# Patient Record
Sex: Female | Born: 1945
Health system: Southern US, Community
[De-identification: ages and names within clinical notes are randomized; demographics above are authoritative.]

## PROBLEM LIST (undated history)

## (undated) DIAGNOSIS — C50919 Malignant neoplasm of unspecified site of unspecified female breast: Secondary | ICD-10-CM

## (undated) DIAGNOSIS — I341 Nonrheumatic mitral (valve) prolapse: Secondary | ICD-10-CM

## (undated) DIAGNOSIS — I10 Essential (primary) hypertension: Secondary | ICD-10-CM

## (undated) HISTORY — DX: Nonrheumatic mitral (valve) prolapse: I34.1

## (undated) HISTORY — DX: Malignant neoplasm of unspecified site of unspecified female breast: C50.919

## (undated) HISTORY — PX: ABDOMINAL HYSTERECTOMY: SHX81

## (undated) HISTORY — PX: BREAST RECONSTRUCTION: SHX9

## (undated) HISTORY — DX: Essential (primary) hypertension: I10

---

## 1986-10-24 DIAGNOSIS — C50919 Malignant neoplasm of unspecified site of unspecified female breast: Secondary | ICD-10-CM

## 1986-10-24 HISTORY — DX: Malignant neoplasm of unspecified site of unspecified female breast: C50.919

## 1986-10-24 HISTORY — PX: MASTECTOMY: SHX3

## 1987-10-25 HISTORY — PX: MASTECTOMY SUBCUTANEOUS: SUR853

## 1987-10-25 HISTORY — PX: AUGMENTATION MAMMAPLASTY: SUR837

## 1987-10-25 HISTORY — PX: BREAST LUMPECTOMY: SHX2

## 2005-04-29 ENCOUNTER — Other Ambulatory Visit: Payer: Self-pay

## 2005-05-13 ENCOUNTER — Inpatient Hospital Stay: Payer: Self-pay | Admitting: Obstetrics and Gynecology

## 2006-02-07 ENCOUNTER — Ambulatory Visit: Payer: Self-pay | Admitting: Gastroenterology

## 2010-10-24 HISTORY — PX: BREAST LUMPECTOMY: SHX2

## 2011-04-13 ENCOUNTER — Ambulatory Visit: Payer: Self-pay | Admitting: Emergency Medicine

## 2011-04-15 ENCOUNTER — Ambulatory Visit: Payer: Self-pay | Admitting: Emergency Medicine

## 2011-04-29 ENCOUNTER — Ambulatory Visit: Payer: Self-pay | Admitting: Oncology

## 2011-05-02 ENCOUNTER — Ambulatory Visit: Payer: Self-pay | Admitting: Oncology

## 2011-05-06 ENCOUNTER — Ambulatory Visit: Payer: Self-pay | Admitting: Emergency Medicine

## 2011-05-10 LAB — PATHOLOGY REPORT

## 2011-05-25 ENCOUNTER — Ambulatory Visit: Payer: Self-pay | Admitting: Oncology

## 2011-06-25 ENCOUNTER — Ambulatory Visit: Payer: Self-pay | Admitting: Oncology

## 2011-07-25 ENCOUNTER — Ambulatory Visit: Payer: Self-pay | Admitting: Oncology

## 2011-08-25 ENCOUNTER — Ambulatory Visit: Payer: Self-pay | Admitting: Oncology

## 2011-09-24 ENCOUNTER — Ambulatory Visit: Payer: Self-pay | Admitting: Oncology

## 2011-12-15 ENCOUNTER — Ambulatory Visit: Payer: Self-pay | Admitting: Oncology

## 2011-12-15 DIAGNOSIS — C50919 Malignant neoplasm of unspecified site of unspecified female breast: Secondary | ICD-10-CM | POA: Diagnosis not present

## 2011-12-15 DIAGNOSIS — Z79899 Other long term (current) drug therapy: Secondary | ICD-10-CM | POA: Diagnosis not present

## 2011-12-15 DIAGNOSIS — Z853 Personal history of malignant neoplasm of breast: Secondary | ICD-10-CM | POA: Diagnosis not present

## 2011-12-15 DIAGNOSIS — Z170 Estrogen receptor positive status [ER+]: Secondary | ICD-10-CM | POA: Diagnosis not present

## 2011-12-15 DIAGNOSIS — Z9010 Acquired absence of unspecified breast and nipple: Secondary | ICD-10-CM | POA: Diagnosis not present

## 2011-12-15 DIAGNOSIS — Z923 Personal history of irradiation: Secondary | ICD-10-CM | POA: Diagnosis not present

## 2011-12-15 DIAGNOSIS — I10 Essential (primary) hypertension: Secondary | ICD-10-CM | POA: Diagnosis not present

## 2011-12-15 LAB — COMPREHENSIVE METABOLIC PANEL
Albumin: 3.9 g/dL (ref 3.4–5.0)
Alkaline Phosphatase: 104 U/L (ref 50–136)
BUN: 13 mg/dL (ref 7–18)
Bilirubin,Total: 0.3 mg/dL (ref 0.2–1.0)
Calcium, Total: 9.2 mg/dL (ref 8.5–10.1)
Creatinine: 0.77 mg/dL (ref 0.60–1.30)
Glucose: 97 mg/dL (ref 65–99)
Osmolality: 281 (ref 275–301)
Sodium: 141 mmol/L (ref 136–145)
Total Protein: 7.6 g/dL (ref 6.4–8.2)

## 2011-12-15 LAB — CBC CANCER CENTER
Basophil #: 0 x10 3/mm (ref 0.0–0.1)
Basophil %: 0.1 %
Eosinophil #: 0.4 x10 3/mm (ref 0.0–0.7)
HCT: 37.4 % (ref 35.0–47.0)
HGB: 12.7 g/dL (ref 12.0–16.0)
Lymphocyte %: 10.3 %
Monocyte #: 0.5 x10 3/mm (ref 0.0–0.7)
Monocyte %: 6.6 %
Neutrophil %: 77.1 %
Platelet: 198 x10 3/mm (ref 150–440)
RBC: 4.2 10*6/uL (ref 3.80–5.20)
RDW: 13.4 % (ref 11.5–14.5)
WBC: 7.3 x10 3/mm (ref 3.6–11.0)

## 2011-12-23 ENCOUNTER — Ambulatory Visit: Payer: Self-pay | Admitting: Oncology

## 2012-02-03 ENCOUNTER — Ambulatory Visit: Payer: Self-pay | Admitting: Oncology

## 2012-02-03 DIAGNOSIS — Z09 Encounter for follow-up examination after completed treatment for conditions other than malignant neoplasm: Secondary | ICD-10-CM | POA: Diagnosis not present

## 2012-02-03 DIAGNOSIS — C50919 Malignant neoplasm of unspecified site of unspecified female breast: Secondary | ICD-10-CM | POA: Diagnosis not present

## 2012-02-03 DIAGNOSIS — Z79811 Long term (current) use of aromatase inhibitors: Secondary | ICD-10-CM | POA: Diagnosis not present

## 2012-02-03 DIAGNOSIS — Z923 Personal history of irradiation: Secondary | ICD-10-CM | POA: Diagnosis not present

## 2012-02-03 DIAGNOSIS — Z170 Estrogen receptor positive status [ER+]: Secondary | ICD-10-CM | POA: Diagnosis not present

## 2012-02-14 IMAGING — NM NM SENTINAL NODE INJECTION (BREAST) - NO REPORT
1 series · 2 of 2 positions shown · non-contrast
Comparison: none

REASON FOR EXAM: wide resection rt breast tumor SN arrive 914am  surgery
at 7204am dissection...
COMMENTS:
TECHNIQUE: Using sterile technique and a 27 gauge, 1/2 inch needle, the
radiopharmaceutical was injected into the subcutaneous tissues of the right
breast at the site of the surgical scar. Planar images were obtained in the
anterior and right lateral projections, both with and without the use of a
Ro-KB transmission source. The patient's arm was abducted at 90 degrees from
the body for the anterior  images, and raised over the head for the lateral
images.

[Series 1000: sent node breast static · 2.40mm/px · 2 of 2 frames shown]
[frame 1/2]
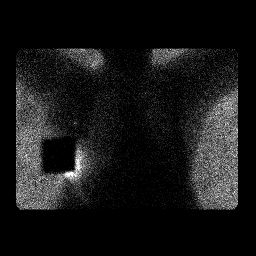
[frame 2/2]
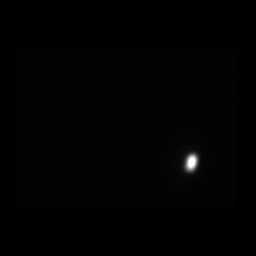

[2 of 2 positions shown; findings below may reference images not displayed]

PROCEDURE:     NM  - NM SENTINEL NODE  BREAST  - May 06, 2011  [DATE]

RESULT:     Comparison:  No comparison.

Radiopharmaceutical:  1.1 mCi 1c-QQm sulfur colloid in 2 ml volume, injected
subcutaneously into the periareolar tissues of the right breast.

Clinical Indication:  64-year-old female with carcinoma of the right breast.
 Lymphatic mapping is requested prior to planned left axillary sentinel node
biopsy for interoperative localization.
FINDINGS: Images obtained at 2 minutes post injection demonstrate
localization of radiotracer in the region of the right breast at the site of
the surgical scar.
IMPRESSION: Successful injection of radiotracer into the right breast
periareolar soft tissues for interoperative localization of right breast
sentinel lymph nodes with a gamma probe.

## 2012-02-22 ENCOUNTER — Ambulatory Visit: Payer: Self-pay | Admitting: Oncology

## 2012-03-28 DIAGNOSIS — I10 Essential (primary) hypertension: Secondary | ICD-10-CM | POA: Diagnosis not present

## 2012-03-28 DIAGNOSIS — N63 Unspecified lump in unspecified breast: Secondary | ICD-10-CM | POA: Diagnosis not present

## 2012-04-11 ENCOUNTER — Ambulatory Visit: Payer: Self-pay | Admitting: Oncology

## 2012-04-11 DIAGNOSIS — N6459 Other signs and symptoms in breast: Secondary | ICD-10-CM | POA: Diagnosis not present

## 2012-04-11 DIAGNOSIS — Z853 Personal history of malignant neoplasm of breast: Secondary | ICD-10-CM | POA: Diagnosis not present

## 2012-04-12 ENCOUNTER — Ambulatory Visit: Payer: Self-pay | Admitting: Oncology

## 2012-04-16 DIAGNOSIS — D0590 Unspecified type of carcinoma in situ of unspecified breast: Secondary | ICD-10-CM | POA: Diagnosis not present

## 2012-04-16 DIAGNOSIS — C50219 Malignant neoplasm of upper-inner quadrant of unspecified female breast: Secondary | ICD-10-CM | POA: Diagnosis not present

## 2012-05-16 DIAGNOSIS — C50219 Malignant neoplasm of upper-inner quadrant of unspecified female breast: Secondary | ICD-10-CM | POA: Diagnosis not present

## 2012-07-10 ENCOUNTER — Ambulatory Visit: Payer: Self-pay | Admitting: Oncology

## 2012-07-10 DIAGNOSIS — C50919 Malignant neoplasm of unspecified site of unspecified female breast: Secondary | ICD-10-CM | POA: Diagnosis not present

## 2012-07-10 DIAGNOSIS — Z923 Personal history of irradiation: Secondary | ICD-10-CM | POA: Diagnosis not present

## 2012-07-10 DIAGNOSIS — Z79811 Long term (current) use of aromatase inhibitors: Secondary | ICD-10-CM | POA: Diagnosis not present

## 2012-07-10 DIAGNOSIS — Z79899 Other long term (current) drug therapy: Secondary | ICD-10-CM | POA: Diagnosis not present

## 2012-07-10 DIAGNOSIS — Z9010 Acquired absence of unspecified breast and nipple: Secondary | ICD-10-CM | POA: Diagnosis not present

## 2012-07-10 DIAGNOSIS — Z170 Estrogen receptor positive status [ER+]: Secondary | ICD-10-CM | POA: Diagnosis not present

## 2012-07-10 DIAGNOSIS — I10 Essential (primary) hypertension: Secondary | ICD-10-CM | POA: Diagnosis not present

## 2012-07-10 LAB — COMPREHENSIVE METABOLIC PANEL
Anion Gap: 3 — ABNORMAL LOW (ref 7–16)
BUN: 16 mg/dL (ref 7–18)
Calcium, Total: 9.3 mg/dL (ref 8.5–10.1)
Chloride: 103 mmol/L (ref 98–107)
Creatinine: 0.78 mg/dL (ref 0.60–1.30)
EGFR (African American): >60
EGFR (Non-African Amer.): >60
Glucose: 94 mg/dL (ref 65–99)
Osmolality: 277 (ref 275–301)
Potassium: 4.5 mmol/L (ref 3.5–5.1)
SGPT (ALT): 25 U/L (ref 12–78)
Sodium: 138 mmol/L (ref 136–145)

## 2012-07-10 LAB — CBC CANCER CENTER
Basophil #: 0 x10 3/mm (ref 0.0–0.1)
Basophil %: 0.5 %
Eosinophil #: 0.5 x10 3/mm (ref 0.0–0.7)
HGB: 12.8 g/dL (ref 12.0–16.0)
Lymphocyte %: 16.3 %
MCHC: 32.9 g/dL (ref 32.0–36.0)
Monocyte #: 0.6 x10 3/mm (ref 0.2–0.9)
Monocyte %: 9.1 %
Neutrophil #: 4.2 x10 3/mm (ref 1.4–6.5)
Neutrophil %: 65.5 %
RBC: 4.38 10*6/uL (ref 3.80–5.20)
RDW: 13.6 % (ref 11.5–14.5)

## 2012-07-11 LAB — CANCER ANTIGEN 27.29: CA 27.29: 18 U/mL (ref 0.0–38.6)

## 2012-07-24 ENCOUNTER — Ambulatory Visit: Payer: Self-pay | Admitting: Oncology

## 2012-09-14 ENCOUNTER — Ambulatory Visit: Payer: Self-pay | Admitting: Oncology

## 2012-09-14 DIAGNOSIS — C50919 Malignant neoplasm of unspecified site of unspecified female breast: Secondary | ICD-10-CM | POA: Diagnosis not present

## 2012-09-14 DIAGNOSIS — Z79811 Long term (current) use of aromatase inhibitors: Secondary | ICD-10-CM | POA: Diagnosis not present

## 2012-09-14 DIAGNOSIS — Z923 Personal history of irradiation: Secondary | ICD-10-CM | POA: Diagnosis not present

## 2012-09-14 DIAGNOSIS — Z170 Estrogen receptor positive status [ER+]: Secondary | ICD-10-CM | POA: Diagnosis not present

## 2012-09-14 DIAGNOSIS — Z09 Encounter for follow-up examination after completed treatment for conditions other than malignant neoplasm: Secondary | ICD-10-CM | POA: Diagnosis not present

## 2012-09-23 ENCOUNTER — Ambulatory Visit: Payer: Self-pay | Admitting: Oncology

## 2012-09-26 DIAGNOSIS — Z923 Personal history of irradiation: Secondary | ICD-10-CM | POA: Diagnosis not present

## 2012-09-26 DIAGNOSIS — I10 Essential (primary) hypertension: Secondary | ICD-10-CM | POA: Diagnosis not present

## 2012-09-26 DIAGNOSIS — C50919 Malignant neoplasm of unspecified site of unspecified female breast: Secondary | ICD-10-CM | POA: Diagnosis not present

## 2012-09-26 DIAGNOSIS — Z23 Encounter for immunization: Secondary | ICD-10-CM | POA: Diagnosis not present

## 2012-10-01 DIAGNOSIS — Z23 Encounter for immunization: Secondary | ICD-10-CM | POA: Diagnosis not present

## 2012-12-22 ENCOUNTER — Ambulatory Visit: Payer: Self-pay | Admitting: Oncology

## 2012-12-22 DIAGNOSIS — Z9010 Acquired absence of unspecified breast and nipple: Secondary | ICD-10-CM | POA: Diagnosis not present

## 2012-12-22 DIAGNOSIS — Z79811 Long term (current) use of aromatase inhibitors: Secondary | ICD-10-CM | POA: Diagnosis not present

## 2012-12-22 DIAGNOSIS — Z923 Personal history of irradiation: Secondary | ICD-10-CM | POA: Diagnosis not present

## 2012-12-22 DIAGNOSIS — I10 Essential (primary) hypertension: Secondary | ICD-10-CM | POA: Diagnosis not present

## 2012-12-22 DIAGNOSIS — Z853 Personal history of malignant neoplasm of breast: Secondary | ICD-10-CM | POA: Diagnosis not present

## 2012-12-22 DIAGNOSIS — Z79899 Other long term (current) drug therapy: Secondary | ICD-10-CM | POA: Diagnosis not present

## 2012-12-22 DIAGNOSIS — C50919 Malignant neoplasm of unspecified site of unspecified female breast: Secondary | ICD-10-CM | POA: Diagnosis not present

## 2012-12-22 DIAGNOSIS — Z170 Estrogen receptor positive status [ER+]: Secondary | ICD-10-CM | POA: Diagnosis not present

## 2012-12-22 DIAGNOSIS — Z7982 Long term (current) use of aspirin: Secondary | ICD-10-CM | POA: Diagnosis not present

## 2013-01-08 DIAGNOSIS — C50919 Malignant neoplasm of unspecified site of unspecified female breast: Secondary | ICD-10-CM | POA: Diagnosis not present

## 2013-01-08 DIAGNOSIS — Z853 Personal history of malignant neoplasm of breast: Secondary | ICD-10-CM | POA: Diagnosis not present

## 2013-01-08 DIAGNOSIS — Z170 Estrogen receptor positive status [ER+]: Secondary | ICD-10-CM | POA: Diagnosis not present

## 2013-01-08 DIAGNOSIS — Z79811 Long term (current) use of aromatase inhibitors: Secondary | ICD-10-CM | POA: Diagnosis not present

## 2013-01-08 LAB — COMPREHENSIVE METABOLIC PANEL
Albumin: 3.9 g/dL (ref 3.4–5.0)
Alkaline Phosphatase: 105 U/L (ref 50–136)
BUN: 17 mg/dL (ref 7–18)
Bilirubin,Total: 0.4 mg/dL (ref 0.2–1.0)
Chloride: 101 mmol/L (ref 98–107)
Creatinine: 0.84 mg/dL (ref 0.60–1.30)
EGFR (African American): >60
EGFR (Non-African Amer.): >60
Glucose: 89 mg/dL (ref 65–99)
Osmolality: 277 (ref 275–301)
Potassium: 4.5 mmol/L (ref 3.5–5.1)
SGOT(AST): 18 U/L (ref 15–37)
Sodium: 138 mmol/L (ref 136–145)
Total Protein: 7.6 g/dL (ref 6.4–8.2)

## 2013-01-08 LAB — LIPID PANEL
Cholesterol: 244 mg/dL — ABNORMAL HIGH (ref 0–200)
HDL Cholesterol: 82 mg/dL — ABNORMAL HIGH (ref 40–60)
Ldl Cholesterol, Calc: 148 mg/dL — ABNORMAL HIGH (ref 0–100)
Triglycerides: 72 mg/dL (ref 0–200)

## 2013-01-08 LAB — CBC CANCER CENTER
Basophil #: 0 x10 3/mm (ref 0.0–0.1)
Eosinophil #: 0.5 x10 3/mm (ref 0.0–0.7)
HGB: 13.3 g/dL (ref 12.0–16.0)
MCHC: 33.8 g/dL (ref 32.0–36.0)
MCV: 87 fL (ref 80–100)
Monocyte %: 7.6 %
Neutrophil #: 3.8 x10 3/mm (ref 1.4–6.5)
Neutrophil %: 65.9 %
RBC: 4.54 10*6/uL (ref 3.80–5.20)
RDW: 13.4 % (ref 11.5–14.5)
WBC: 5.8 x10 3/mm (ref 3.6–11.0)

## 2013-01-08 LAB — TSH: Thyroid Stimulating Horm: 1.5 u[IU]/mL

## 2013-01-09 LAB — CANCER ANTIGEN 27.29: CA 27.29: 15.1 U/mL (ref 0.0–38.6)

## 2013-01-22 ENCOUNTER — Ambulatory Visit: Payer: Self-pay | Admitting: Oncology

## 2013-01-23 DIAGNOSIS — M75120 Complete rotator cuff tear or rupture of unspecified shoulder, not specified as traumatic: Secondary | ICD-10-CM | POA: Diagnosis not present

## 2013-01-23 DIAGNOSIS — I10 Essential (primary) hypertension: Secondary | ICD-10-CM | POA: Diagnosis not present

## 2013-01-23 DIAGNOSIS — R002 Palpitations: Secondary | ICD-10-CM | POA: Diagnosis not present

## 2013-02-19 DIAGNOSIS — H2510 Age-related nuclear cataract, unspecified eye: Secondary | ICD-10-CM | POA: Diagnosis not present

## 2013-03-13 ENCOUNTER — Ambulatory Visit: Payer: Self-pay | Admitting: Radiation Oncology

## 2013-03-13 DIAGNOSIS — Z170 Estrogen receptor positive status [ER+]: Secondary | ICD-10-CM | POA: Diagnosis not present

## 2013-03-13 DIAGNOSIS — Z09 Encounter for follow-up examination after completed treatment for conditions other than malignant neoplasm: Secondary | ICD-10-CM | POA: Diagnosis not present

## 2013-03-13 DIAGNOSIS — Z79811 Long term (current) use of aromatase inhibitors: Secondary | ICD-10-CM | POA: Diagnosis not present

## 2013-03-13 DIAGNOSIS — Z923 Personal history of irradiation: Secondary | ICD-10-CM | POA: Diagnosis not present

## 2013-03-13 DIAGNOSIS — Z9010 Acquired absence of unspecified breast and nipple: Secondary | ICD-10-CM | POA: Diagnosis not present

## 2013-03-13 DIAGNOSIS — C50919 Malignant neoplasm of unspecified site of unspecified female breast: Secondary | ICD-10-CM | POA: Diagnosis not present

## 2013-03-14 DIAGNOSIS — C50919 Malignant neoplasm of unspecified site of unspecified female breast: Secondary | ICD-10-CM | POA: Diagnosis not present

## 2013-03-14 DIAGNOSIS — Z170 Estrogen receptor positive status [ER+]: Secondary | ICD-10-CM | POA: Diagnosis not present

## 2013-03-14 DIAGNOSIS — Z923 Personal history of irradiation: Secondary | ICD-10-CM | POA: Diagnosis not present

## 2013-03-14 DIAGNOSIS — Z09 Encounter for follow-up examination after completed treatment for conditions other than malignant neoplasm: Secondary | ICD-10-CM | POA: Diagnosis not present

## 2013-03-14 DIAGNOSIS — Z79811 Long term (current) use of aromatase inhibitors: Secondary | ICD-10-CM | POA: Diagnosis not present

## 2013-03-14 DIAGNOSIS — Z9010 Acquired absence of unspecified breast and nipple: Secondary | ICD-10-CM | POA: Diagnosis not present

## 2013-03-24 ENCOUNTER — Ambulatory Visit: Payer: Self-pay | Admitting: Radiation Oncology

## 2013-03-24 DIAGNOSIS — M810 Age-related osteoporosis without current pathological fracture: Secondary | ICD-10-CM | POA: Diagnosis not present

## 2013-03-24 DIAGNOSIS — Z853 Personal history of malignant neoplasm of breast: Secondary | ICD-10-CM | POA: Diagnosis not present

## 2013-03-24 DIAGNOSIS — Z13820 Encounter for screening for osteoporosis: Secondary | ICD-10-CM | POA: Diagnosis not present

## 2013-04-15 DIAGNOSIS — Z853 Personal history of malignant neoplasm of breast: Secondary | ICD-10-CM | POA: Diagnosis not present

## 2013-04-15 DIAGNOSIS — R928 Other abnormal and inconclusive findings on diagnostic imaging of breast: Secondary | ICD-10-CM | POA: Diagnosis not present

## 2013-04-15 DIAGNOSIS — M810 Age-related osteoporosis without current pathological fracture: Secondary | ICD-10-CM | POA: Diagnosis not present

## 2013-04-23 ENCOUNTER — Ambulatory Visit: Payer: Self-pay | Admitting: Radiation Oncology

## 2013-04-23 DIAGNOSIS — Z13820 Encounter for screening for osteoporosis: Secondary | ICD-10-CM | POA: Diagnosis not present

## 2013-04-23 DIAGNOSIS — Z853 Personal history of malignant neoplasm of breast: Secondary | ICD-10-CM | POA: Diagnosis not present

## 2013-04-23 DIAGNOSIS — M810 Age-related osteoporosis without current pathological fracture: Secondary | ICD-10-CM | POA: Diagnosis not present

## 2013-05-10 LAB — URINALYSIS, COMPLETE
Bilirubin,UR: NEGATIVE
Glucose,UR: NEGATIVE mg/dL (ref 0–75)
Hyaline Cast: 1
Nitrite: NEGATIVE
Ph: 6 (ref 4.5–8.0)
Protein: NEGATIVE
Specific Gravity: 1.003 (ref 1.003–1.030)
Squamous Epithelial: <1

## 2013-05-12 LAB — URINE CULTURE

## 2013-05-24 ENCOUNTER — Ambulatory Visit: Payer: Self-pay | Admitting: Radiation Oncology

## 2013-07-08 ENCOUNTER — Ambulatory Visit: Payer: Self-pay | Admitting: Oncology

## 2013-07-08 DIAGNOSIS — Z79811 Long term (current) use of aromatase inhibitors: Secondary | ICD-10-CM | POA: Diagnosis not present

## 2013-07-08 DIAGNOSIS — I10 Essential (primary) hypertension: Secondary | ICD-10-CM | POA: Diagnosis not present

## 2013-07-08 DIAGNOSIS — Z923 Personal history of irradiation: Secondary | ICD-10-CM | POA: Diagnosis not present

## 2013-07-08 DIAGNOSIS — C50919 Malignant neoplasm of unspecified site of unspecified female breast: Secondary | ICD-10-CM | POA: Diagnosis not present

## 2013-07-08 DIAGNOSIS — Z170 Estrogen receptor positive status [ER+]: Secondary | ICD-10-CM | POA: Diagnosis not present

## 2013-07-08 DIAGNOSIS — Z90710 Acquired absence of both cervix and uterus: Secondary | ICD-10-CM | POA: Diagnosis not present

## 2013-07-08 DIAGNOSIS — Z7982 Long term (current) use of aspirin: Secondary | ICD-10-CM | POA: Diagnosis not present

## 2013-07-08 DIAGNOSIS — Z9010 Acquired absence of unspecified breast and nipple: Secondary | ICD-10-CM | POA: Diagnosis not present

## 2013-07-08 DIAGNOSIS — Z79899 Other long term (current) drug therapy: Secondary | ICD-10-CM | POA: Diagnosis not present

## 2013-07-09 DIAGNOSIS — Z79811 Long term (current) use of aromatase inhibitors: Secondary | ICD-10-CM | POA: Diagnosis not present

## 2013-07-09 DIAGNOSIS — Z170 Estrogen receptor positive status [ER+]: Secondary | ICD-10-CM | POA: Diagnosis not present

## 2013-07-09 DIAGNOSIS — C50919 Malignant neoplasm of unspecified site of unspecified female breast: Secondary | ICD-10-CM | POA: Diagnosis not present

## 2013-07-09 DIAGNOSIS — Z79899 Other long term (current) drug therapy: Secondary | ICD-10-CM | POA: Diagnosis not present

## 2013-07-09 LAB — COMPREHENSIVE METABOLIC PANEL
Albumin: 4 g/dL (ref 3.4–5.0)
Alkaline Phosphatase: 119 U/L (ref 50–136)
Anion Gap: 8 (ref 7–16)
BUN: 16 mg/dL (ref 7–18)
Calcium, Total: 9.4 mg/dL (ref 8.5–10.1)
Co2: 29 mmol/L (ref 21–32)
Creatinine: 0.71 mg/dL (ref 0.60–1.30)
EGFR (African American): >60
EGFR (Non-African Amer.): >60
Osmolality: 278 (ref 275–301)
SGOT(AST): 17 U/L (ref 15–37)
SGPT (ALT): 20 U/L (ref 12–78)
Sodium: 139 mmol/L (ref 136–145)
Total Protein: 7.3 g/dL (ref 6.4–8.2)

## 2013-07-24 ENCOUNTER — Ambulatory Visit: Payer: Self-pay | Admitting: Oncology

## 2013-09-04 DIAGNOSIS — Z23 Encounter for immunization: Secondary | ICD-10-CM | POA: Diagnosis not present

## 2014-01-07 ENCOUNTER — Ambulatory Visit: Payer: Self-pay | Admitting: Oncology

## 2014-01-07 DIAGNOSIS — I10 Essential (primary) hypertension: Secondary | ICD-10-CM | POA: Diagnosis not present

## 2014-01-07 DIAGNOSIS — Z853 Personal history of malignant neoplasm of breast: Secondary | ICD-10-CM | POA: Diagnosis not present

## 2014-01-07 DIAGNOSIS — C50919 Malignant neoplasm of unspecified site of unspecified female breast: Secondary | ICD-10-CM | POA: Diagnosis not present

## 2014-01-07 DIAGNOSIS — Z7982 Long term (current) use of aspirin: Secondary | ICD-10-CM | POA: Diagnosis not present

## 2014-01-07 DIAGNOSIS — Z79811 Long term (current) use of aromatase inhibitors: Secondary | ICD-10-CM | POA: Diagnosis not present

## 2014-01-07 DIAGNOSIS — Z79899 Other long term (current) drug therapy: Secondary | ICD-10-CM | POA: Diagnosis not present

## 2014-01-07 DIAGNOSIS — Z170 Estrogen receptor positive status [ER+]: Secondary | ICD-10-CM | POA: Diagnosis not present

## 2014-01-07 DIAGNOSIS — Z923 Personal history of irradiation: Secondary | ICD-10-CM | POA: Diagnosis not present

## 2014-01-07 LAB — CBC CANCER CENTER
BASOS PCT: 0.8 %
Basophil #: 0.1 x10 3/mm (ref 0.0–0.1)
EOS PCT: 9.3 %
Eosinophil #: 0.7 x10 3/mm (ref 0.0–0.7)
HCT: 40.5 % (ref 35.0–47.0)
HGB: 13.3 g/dL (ref 12.0–16.0)
LYMPHS ABS: 1.3 x10 3/mm (ref 1.0–3.6)
LYMPHS PCT: 17.2 %
MCH: 28.7 pg (ref 26.0–34.0)
MCHC: 32.9 g/dL (ref 32.0–36.0)
MCV: 87 fL (ref 80–100)
Monocyte #: 0.5 x10 3/mm (ref 0.2–0.9)
Monocyte %: 6.4 %
NEUTROS ABS: 5 x10 3/mm (ref 1.4–6.5)
NEUTROS PCT: 66.3 %
PLATELETS: 196 x10 3/mm (ref 150–440)
RBC: 4.64 10*6/uL (ref 3.80–5.20)
RDW: 13.8 % (ref 11.5–14.5)
WBC: 7.6 x10 3/mm (ref 3.6–11.0)

## 2014-01-07 LAB — COMPREHENSIVE METABOLIC PANEL
ALK PHOS: 84 U/L
Albumin: 4 g/dL (ref 3.4–5.0)
Anion Gap: 13 (ref 7–16)
BILIRUBIN TOTAL: 0.5 mg/dL (ref 0.2–1.0)
BUN: 18 mg/dL (ref 7–18)
CHLORIDE: 101 mmol/L (ref 98–107)
Calcium, Total: 9.5 mg/dL (ref 8.5–10.1)
Co2: 24 mmol/L (ref 21–32)
Creatinine: 0.74 mg/dL (ref 0.60–1.30)
EGFR (African American): >60
EGFR (Non-African Amer.): >60
Glucose: 104 mg/dL — ABNORMAL HIGH (ref 65–99)
Osmolality: 278 (ref 275–301)
POTASSIUM: 4 mmol/L (ref 3.5–5.1)
SGOT(AST): 18 U/L (ref 15–37)
SGPT (ALT): 17 U/L (ref 12–78)
SODIUM: 138 mmol/L (ref 136–145)
TOTAL PROTEIN: 7.5 g/dL (ref 6.4–8.2)

## 2014-01-09 LAB — CANCER ANTIGEN 27.29: CA 27.29: 17.9 U/mL (ref 0.0–38.6)

## 2014-01-22 ENCOUNTER — Ambulatory Visit: Payer: Self-pay | Admitting: Oncology

## 2014-03-14 ENCOUNTER — Ambulatory Visit: Payer: Self-pay | Admitting: Oncology

## 2014-03-14 DIAGNOSIS — C50919 Malignant neoplasm of unspecified site of unspecified female breast: Secondary | ICD-10-CM | POA: Diagnosis not present

## 2014-03-21 DIAGNOSIS — I10 Essential (primary) hypertension: Secondary | ICD-10-CM | POA: Diagnosis not present

## 2014-06-24 DIAGNOSIS — H2510 Age-related nuclear cataract, unspecified eye: Secondary | ICD-10-CM | POA: Diagnosis not present

## 2014-07-16 ENCOUNTER — Ambulatory Visit: Payer: Self-pay | Admitting: Oncology

## 2014-07-16 DIAGNOSIS — Z9010 Acquired absence of unspecified breast and nipple: Secondary | ICD-10-CM | POA: Diagnosis not present

## 2014-07-16 DIAGNOSIS — F411 Generalized anxiety disorder: Secondary | ICD-10-CM | POA: Diagnosis not present

## 2014-07-16 DIAGNOSIS — Z79899 Other long term (current) drug therapy: Secondary | ICD-10-CM | POA: Diagnosis not present

## 2014-07-16 DIAGNOSIS — C50919 Malignant neoplasm of unspecified site of unspecified female breast: Secondary | ICD-10-CM | POA: Diagnosis not present

## 2014-07-16 DIAGNOSIS — Z853 Personal history of malignant neoplasm of breast: Secondary | ICD-10-CM | POA: Diagnosis not present

## 2014-07-16 DIAGNOSIS — Z79811 Long term (current) use of aromatase inhibitors: Secondary | ICD-10-CM | POA: Diagnosis not present

## 2014-07-16 DIAGNOSIS — Z170 Estrogen receptor positive status [ER+]: Secondary | ICD-10-CM | POA: Diagnosis not present

## 2014-07-16 DIAGNOSIS — Z7982 Long term (current) use of aspirin: Secondary | ICD-10-CM | POA: Diagnosis not present

## 2014-07-16 DIAGNOSIS — I10 Essential (primary) hypertension: Secondary | ICD-10-CM | POA: Diagnosis not present

## 2014-07-16 LAB — CBC CANCER CENTER
Basophil #: 0 x10 3/mm (ref 0.0–0.1)
Basophil %: 0.5 %
Eosinophil #: 0.6 x10 3/mm (ref 0.0–0.7)
Eosinophil %: 7.6 %
HCT: 41.2 % (ref 35.0–47.0)
HGB: 13.6 g/dL (ref 12.0–16.0)
Lymphocyte #: 1.3 x10 3/mm (ref 1.0–3.6)
Lymphocyte %: 16.8 %
MCH: 29.1 pg (ref 26.0–34.0)
MCHC: 33 g/dL (ref 32.0–36.0)
MCV: 88 fL (ref 80–100)
MONO ABS: 0.5 x10 3/mm (ref 0.2–0.9)
Monocyte %: 7.1 %
NEUTROS PCT: 68 %
Neutrophil #: 5.2 x10 3/mm (ref 1.4–6.5)
Platelet: 213 x10 3/mm (ref 150–440)
RBC: 4.67 10*6/uL (ref 3.80–5.20)
RDW: 13.6 % (ref 11.5–14.5)
WBC: 7.7 x10 3/mm (ref 3.6–11.0)

## 2014-07-16 LAB — COMPREHENSIVE METABOLIC PANEL
ALBUMIN: 3.8 g/dL (ref 3.4–5.0)
Alkaline Phosphatase: 91 U/L
Anion Gap: 8 (ref 7–16)
BUN: 16 mg/dL (ref 7–18)
Bilirubin,Total: 0.4 mg/dL (ref 0.2–1.0)
CALCIUM: 9.5 mg/dL (ref 8.5–10.1)
Chloride: 100 mmol/L (ref 98–107)
Co2: 27 mmol/L (ref 21–32)
Creatinine: 0.76 mg/dL (ref 0.60–1.30)
EGFR (African American): >60
Glucose: 100 mg/dL — ABNORMAL HIGH (ref 65–99)
OSMOLALITY: 271 (ref 275–301)
POTASSIUM: 4.2 mmol/L (ref 3.5–5.1)
SGOT(AST): 15 U/L (ref 15–37)
SGPT (ALT): 23 U/L
Sodium: 135 mmol/L — ABNORMAL LOW (ref 136–145)
Total Protein: 7.2 g/dL (ref 6.4–8.2)

## 2014-07-17 LAB — CANCER ANTIGEN 27.29: CA 27.29: 7.1 U/mL (ref 0.0–38.6)

## 2014-07-21 DIAGNOSIS — E785 Hyperlipidemia, unspecified: Secondary | ICD-10-CM | POA: Diagnosis not present

## 2014-07-21 DIAGNOSIS — I10 Essential (primary) hypertension: Secondary | ICD-10-CM | POA: Diagnosis not present

## 2014-07-24 ENCOUNTER — Ambulatory Visit: Payer: Self-pay | Admitting: Oncology

## 2014-08-05 DIAGNOSIS — E784 Other hyperlipidemia: Secondary | ICD-10-CM | POA: Diagnosis not present

## 2014-08-05 DIAGNOSIS — I10 Essential (primary) hypertension: Secondary | ICD-10-CM | POA: Diagnosis not present

## 2015-01-14 ENCOUNTER — Ambulatory Visit
Admit: 2015-01-14 | Disposition: A | Discharge status: Home / Self Care | Payer: Self-pay | Attending: Oncology | Admitting: Oncology

## 2015-01-14 DIAGNOSIS — C50912 Malignant neoplasm of unspecified site of left female breast: Secondary | ICD-10-CM | POA: Diagnosis not present

## 2015-01-14 DIAGNOSIS — F419 Anxiety disorder, unspecified: Secondary | ICD-10-CM | POA: Diagnosis not present

## 2015-01-14 DIAGNOSIS — Z79811 Long term (current) use of aromatase inhibitors: Secondary | ICD-10-CM | POA: Diagnosis not present

## 2015-01-14 DIAGNOSIS — Z79899 Other long term (current) drug therapy: Secondary | ICD-10-CM | POA: Diagnosis not present

## 2015-01-14 DIAGNOSIS — Z90710 Acquired absence of both cervix and uterus: Secondary | ICD-10-CM | POA: Diagnosis not present

## 2015-01-14 DIAGNOSIS — Z170 Estrogen receptor positive status [ER+]: Secondary | ICD-10-CM | POA: Diagnosis not present

## 2015-01-14 DIAGNOSIS — Z87891 Personal history of nicotine dependence: Secondary | ICD-10-CM | POA: Diagnosis not present

## 2015-01-14 DIAGNOSIS — I10 Essential (primary) hypertension: Secondary | ICD-10-CM | POA: Diagnosis not present

## 2015-01-16 LAB — COMPREHENSIVE METABOLIC PANEL
ALT: 13 U/L — AB
AST: 17 U/L
Albumin: 4.4 g/dL
Alkaline Phosphatase: 74 U/L
Anion Gap: 6 — ABNORMAL LOW (ref 7–16)
BILIRUBIN TOTAL: 0.6 mg/dL
BUN: 15 mg/dL
Calcium, Total: 9.3 mg/dL
Chloride: 103 mmol/L
Co2: 27 mmol/L
Creatinine: 0.58 mg/dL
EGFR (African American): >60
Glucose: 101 mg/dL — ABNORMAL HIGH
POTASSIUM: 4.4 mmol/L
Sodium: 136 mmol/L
TOTAL PROTEIN: 7.6 g/dL

## 2015-01-16 LAB — CBC CANCER CENTER
Basophil #: 0 x10 3/mm (ref 0.0–0.1)
Basophil %: 0.6 %
EOS PCT: 8.9 %
Eosinophil #: 0.6 x10 3/mm (ref 0.0–0.7)
HCT: 39.3 % (ref 35.0–47.0)
HGB: 13.1 g/dL (ref 12.0–16.0)
Lymphocyte #: 1.3 x10 3/mm (ref 1.0–3.6)
Lymphocyte %: 19.5 %
MCH: 29.3 pg (ref 26.0–34.0)
MCHC: 33.4 g/dL (ref 32.0–36.0)
MCV: 88 fL (ref 80–100)
Monocyte #: 0.5 x10 3/mm (ref 0.2–0.9)
Monocyte %: 7 %
NEUTROS PCT: 64 %
Neutrophil #: 4.2 x10 3/mm (ref 1.4–6.5)
Platelet: 209 x10 3/mm (ref 150–440)
RBC: 4.47 10*6/uL (ref 3.80–5.20)
RDW: 13.7 % (ref 11.5–14.5)
WBC: 6.6 x10 3/mm (ref 3.6–11.0)

## 2015-01-19 DIAGNOSIS — I10 Essential (primary) hypertension: Secondary | ICD-10-CM | POA: Diagnosis not present

## 2015-01-19 DIAGNOSIS — C50919 Malignant neoplasm of unspecified site of unspecified female breast: Secondary | ICD-10-CM | POA: Diagnosis not present

## 2015-01-19 DIAGNOSIS — M75120 Complete rotator cuff tear or rupture of unspecified shoulder, not specified as traumatic: Secondary | ICD-10-CM | POA: Diagnosis not present

## 2015-01-19 LAB — CANCER ANTIGEN 27.29: CA 27.29: 11.6 U/mL (ref 0.0–38.6)

## 2015-01-23 ENCOUNTER — Ambulatory Visit
Admit: 2015-01-23 | Disposition: A | Discharge status: Home / Self Care | Payer: Self-pay | Attending: Oncology | Admitting: Oncology

## 2015-02-06 ENCOUNTER — Other Ambulatory Visit: Payer: Self-pay | Admitting: Oncology

## 2015-02-06 DIAGNOSIS — Z79891 Long term (current) use of opiate analgesic: Secondary | ICD-10-CM

## 2015-02-06 DIAGNOSIS — M899 Disorder of bone, unspecified: Secondary | ICD-10-CM

## 2015-03-20 ENCOUNTER — Encounter: Payer: Self-pay | Admitting: Radiation Oncology

## 2015-03-20 ENCOUNTER — Ambulatory Visit
Admission: RE | Admit: 2015-03-20 | Discharge: 2015-03-20 | Disposition: A | Discharge status: Home / Self Care | Payer: Medicare Other | Source: Ambulatory Visit | Attending: Oncology | Admitting: Oncology

## 2015-03-20 ENCOUNTER — Encounter (INDEPENDENT_AMBULATORY_CARE_PROVIDER_SITE_OTHER): Payer: Self-pay

## 2015-03-20 VITALS — BP 166/89 | HR 60 | Temp 95.4°F | Resp 20 | Ht 67.0 in | Wt 169.4 lb

## 2015-03-20 DIAGNOSIS — C50911 Malignant neoplasm of unspecified site of right female breast: Secondary | ICD-10-CM

## 2015-03-20 DIAGNOSIS — Z853 Personal history of malignant neoplasm of breast: Secondary | ICD-10-CM | POA: Diagnosis not present

## 2015-03-20 NOTE — Progress Notes (Signed)
Radiation Oncology Follow up Note  Name: Deborah Trevino   Date:   03/20/2015 MRN:  [id number] DOB: [birth year]    This 69 y.o. female presents to the clinic today for follow-up for invasive lobular carcinoma the right breast.  REFERRING PROVIDER: No ref. provider found  HPI: Patient is a 69 year old female presented in June 2012 invasive lobular carcinoma of the right breast measuring after re-resection 4.5 cm. She also history of breast cancer in 1988 status post modified radical mastectomy. She had prophylactic subcutaneous mastectomy in the right with multifocal ductal carcinoma in situ. Reconstruction was performed bilaterally 1988. She is now out 3/2 years completing radiation therapy to her right breast. She is doing well. She has been on room index this time that well without side effect..  COMPLICATIONS OF TREATMENT: none  FOLLOW UP COMPLIANCE: keeps appointments   PHYSICAL EXAM:  BP 166/89 mmHg  Pulse 60  Temp(Src) 95.4 F (35.2 C)  Resp 20  Ht 5\' 7"  (1.702 m)  Wt 169 lb 6.8 oz (76.85 kg)  BMI 26.53 kg/m2 Well-developed female in NAD. She status post bilateral breast reconstruction. No dominant mass or nodularity is noted in either breast in 2 positions examined. No axillary or supraclavicular adenopathy is a identified bilaterally. Cosmetic result is fair. Well-developed well-nourished patient in NAD. HEENT reveals PERLA, EOMI, discs not visualized.  Oral cavity is clear. No oral mucosal lesions are identified. Neck is clear without evidence of cervical or supraclavicular adenopathy. Lungs are clear to A&P. Cardiac examination is essentially unremarkable with regular rate and rhythm without murmur rub or thrill. Abdomen is benign with no organomegaly or masses noted. Motor sensory and DTR levels are equal and symmetric in the upper and lower extremities. Cranial nerves II through XII are grossly intact. Proprioception is intact. No peripheral adenopathy or edema is identified.  No motor or sensory levels are noted. Crude visual fields are within normal range.   RADIOLOGY RESULTS: Her last mammograms were in 2014 based on the discomfort with bilateral reconstruction no further mammograms are to be performed  PLAN: At the present time she continues to do well with no evidence of disease. I'll see her out in 1 year for follow-up and then discontinue follow-up care. She continues on Arimidex without side effect. Patient is to call with any concerns.  I would like to take this opportunity for allowing me to participate in the care of your patient.Armstead Peaks., MD

## 2015-04-20 ENCOUNTER — Ambulatory Visit
Admission: RE | Admit: 2015-04-20 | Discharge: 2015-04-20 | Disposition: A | Discharge status: Home / Self Care | Payer: Medicare Other | Source: Ambulatory Visit | Attending: Oncology | Admitting: Oncology

## 2015-04-20 ENCOUNTER — Other Ambulatory Visit: Payer: Self-pay | Admitting: Oncology

## 2015-04-20 ENCOUNTER — Ambulatory Visit: Payer: Medicare Other

## 2015-04-20 DIAGNOSIS — C50911 Malignant neoplasm of unspecified site of right female breast: Secondary | ICD-10-CM

## 2015-04-20 DIAGNOSIS — Z9012 Acquired absence of left breast and nipple: Secondary | ICD-10-CM | POA: Insufficient documentation

## 2015-04-20 DIAGNOSIS — Z853 Personal history of malignant neoplasm of breast: Secondary | ICD-10-CM

## 2015-04-20 DIAGNOSIS — Z79891 Long term (current) use of opiate analgesic: Secondary | ICD-10-CM

## 2015-04-20 DIAGNOSIS — M8580 Other specified disorders of bone density and structure, unspecified site: Secondary | ICD-10-CM | POA: Insufficient documentation

## 2015-04-20 DIAGNOSIS — M899 Disorder of bone, unspecified: Secondary | ICD-10-CM

## 2015-04-20 DIAGNOSIS — M810 Age-related osteoporosis without current pathological fracture: Secondary | ICD-10-CM | POA: Diagnosis not present

## 2015-04-20 DIAGNOSIS — N63 Unspecified lump in breast: Secondary | ICD-10-CM | POA: Diagnosis not present

## 2015-04-20 DIAGNOSIS — Z780 Asymptomatic menopausal state: Secondary | ICD-10-CM | POA: Diagnosis not present

## 2015-04-21 ENCOUNTER — Other Ambulatory Visit: Payer: Medicare Other

## 2015-04-21 ENCOUNTER — Ambulatory Visit: Payer: Medicare Other | Admitting: Oncology

## 2015-07-17 ENCOUNTER — Other Ambulatory Visit: Payer: Self-pay | Admitting: *Deleted

## 2015-07-17 DIAGNOSIS — C50919 Malignant neoplasm of unspecified site of unspecified female breast: Secondary | ICD-10-CM

## 2015-07-22 ENCOUNTER — Other Ambulatory Visit: Payer: Self-pay | Admitting: Family Medicine

## 2015-07-22 ENCOUNTER — Inpatient Hospital Stay: Payer: Medicare Other

## 2015-07-22 ENCOUNTER — Encounter: Payer: Self-pay | Admitting: Oncology

## 2015-07-22 ENCOUNTER — Inpatient Hospital Stay: Payer: Medicare Other | Attending: Oncology | Admitting: Oncology

## 2015-07-22 ENCOUNTER — Encounter (INDEPENDENT_AMBULATORY_CARE_PROVIDER_SITE_OTHER): Payer: Self-pay

## 2015-07-22 VITALS — BP 163/89 | HR 80 | Temp 96.4°F | Wt 146.4 lb

## 2015-07-22 DIAGNOSIS — I159 Secondary hypertension, unspecified: Secondary | ICD-10-CM

## 2015-07-22 DIAGNOSIS — Z9013 Acquired absence of bilateral breasts and nipples: Secondary | ICD-10-CM | POA: Diagnosis not present

## 2015-07-22 DIAGNOSIS — Z79899 Other long term (current) drug therapy: Secondary | ICD-10-CM | POA: Insufficient documentation

## 2015-07-22 DIAGNOSIS — Z0000 Encounter for general adult medical examination without abnormal findings: Secondary | ICD-10-CM

## 2015-07-22 DIAGNOSIS — C50919 Malignant neoplasm of unspecified site of unspecified female breast: Secondary | ICD-10-CM | POA: Insufficient documentation

## 2015-07-22 DIAGNOSIS — Z853 Personal history of malignant neoplasm of breast: Secondary | ICD-10-CM | POA: Diagnosis not present

## 2015-07-22 DIAGNOSIS — Z79811 Long term (current) use of aromatase inhibitors: Secondary | ICD-10-CM | POA: Insufficient documentation

## 2015-07-22 DIAGNOSIS — C50911 Malignant neoplasm of unspecified site of right female breast: Secondary | ICD-10-CM | POA: Diagnosis not present

## 2015-07-22 DIAGNOSIS — R5383 Other fatigue: Secondary | ICD-10-CM

## 2015-07-22 DIAGNOSIS — Z170 Estrogen receptor positive status [ER+]: Secondary | ICD-10-CM | POA: Insufficient documentation

## 2015-07-22 DIAGNOSIS — I341 Nonrheumatic mitral (valve) prolapse: Secondary | ICD-10-CM | POA: Insufficient documentation

## 2015-07-22 DIAGNOSIS — Z87891 Personal history of nicotine dependence: Secondary | ICD-10-CM | POA: Insufficient documentation

## 2015-07-22 DIAGNOSIS — C50912 Malignant neoplasm of unspecified site of left female breast: Secondary | ICD-10-CM

## 2015-07-22 DIAGNOSIS — Z923 Personal history of irradiation: Secondary | ICD-10-CM | POA: Diagnosis not present

## 2015-07-22 DIAGNOSIS — I10 Essential (primary) hypertension: Secondary | ICD-10-CM | POA: Diagnosis not present

## 2015-07-22 LAB — CBC WITH DIFFERENTIAL/PLATELET
BASOS PCT: 0 %
Basophils Absolute: 0 10*3/uL (ref 0–0.1)
Eosinophils Absolute: 0.2 10*3/uL (ref 0–0.7)
Eosinophils Relative: 2 %
HEMATOCRIT: 40.6 % (ref 35.0–47.0)
HEMOGLOBIN: 13.7 g/dL (ref 12.0–16.0)
LYMPHS ABS: 0.8 10*3/uL — AB (ref 1.0–3.6)
LYMPHS PCT: 11 %
MCH: 29.2 pg (ref 26.0–34.0)
MCHC: 33.7 g/dL (ref 32.0–36.0)
MCV: 86.7 fL (ref 80.0–100.0)
MONO ABS: 0.5 10*3/uL (ref 0.2–0.9)
MONOS PCT: 7 %
Neutro Abs: 6.1 10*3/uL (ref 1.4–6.5)
Neutrophils Relative %: 80 %
Platelets: 206 10*3/uL (ref 150–440)
RBC: 4.68 MIL/uL (ref 3.80–5.20)
RDW: 13.6 % (ref 11.5–14.5)
WBC: 7.6 10*3/uL (ref 3.6–11.0)

## 2015-07-22 LAB — COMPREHENSIVE METABOLIC PANEL
ALT: 14 U/L (ref 14–54)
ANION GAP: 9 (ref 5–15)
AST: 17 U/L (ref 15–41)
Albumin: 4.6 g/dL (ref 3.5–5.0)
Alkaline Phosphatase: 77 U/L (ref 38–126)
BILIRUBIN TOTAL: 0.8 mg/dL (ref 0.3–1.2)
BUN: 15 mg/dL (ref 6–20)
CO2: 24 mmol/L (ref 22–32)
Calcium: 8.9 mg/dL (ref 8.9–10.3)
Chloride: 100 mmol/L — ABNORMAL LOW (ref 101–111)
Creatinine, Ser: 0.66 mg/dL (ref 0.44–1.00)
GFR calc Af Amer: >60 mL/min (ref >60)
Glucose, Bld: 103 mg/dL — ABNORMAL HIGH (ref 65–99)
Potassium: 4 mmol/L (ref 3.5–5.1)
Sodium: 133 mmol/L — ABNORMAL LOW (ref 135–145)
TOTAL PROTEIN: 7.8 g/dL (ref 6.5–8.1)

## 2015-07-22 LAB — LIPID PANEL
Cholesterol: 229 mg/dL — ABNORMAL HIGH (ref 0–200)
HDL: 69 mg/dL (ref >40)
LDL CALC: 152 mg/dL — AB (ref 0–99)
Total CHOL/HDL Ratio: 3.3 RATIO
Triglycerides: 40 mg/dL (ref <150)
VLDL: 8 mg/dL (ref 0–40)

## 2015-07-22 LAB — TSH: TSH: 2.319 u[IU]/mL (ref 0.350–4.500)

## 2015-07-22 NOTE — Progress Notes (Signed)
[facility]  [phone]  Fax:([phone]) [phone]     Deborah Trevino DOB: 08-24-1946  MR#: [id number]  YDX#:[id number]  Patient Care Team: Cletis Athens, MD as PCP - General (Internal Medicine)  CHIEF COMPLAINT:  Chief Complaint  Patient presents with  . OTHER   Patient with significant past medical history for Left sided invasive ductal carcinoma in the left breast, status post modified radical mastectomy in 1988 as well as Invasive lobular carcinoma in June 2012.   INTERVAL HISTORY:  Patient is here for further evaluation and treatment consideration regarding breast cancer. She is status post excision for infiltrating lobular carcinoma with positive margins, XRT of right breast for a T2, stage II infiltrating lobular carcinoma. She has had subcutaneous mastectomies in the past with reconstruction in 1988. Reexcision margins were negative and 5 nodes were negative. She is currently on Arimidex and tolerating well. She reports having lost 35 pounds over the last several months with diet and exercise. She states that this has improved joint pain and aches. Last mammogram was performed in June 2016. Patient reports monthly self breast exams and denies any abnormalities. She otherwise feels very well and denies any other complaints.  REVIEW OF SYSTEMS:   Review of Systems  Constitutional: Positive for weight loss. Negative for fever, chills, malaise/fatigue and diaphoresis.       Intentional weight loss of nearly 35 pounds over 7 months  HENT: Negative for congestion, ear discharge, ear pain, hearing loss, nosebleeds, sore throat and tinnitus.   Eyes: Negative for blurred vision, double vision, photophobia, pain, discharge and redness.  Respiratory: Negative for cough, hemoptysis, sputum production, shortness of breath, wheezing and stridor.   Cardiovascular: Negative for chest pain, palpitations, orthopnea, claudication, leg swelling and PND.  Gastrointestinal:  Negative for heartburn, nausea, vomiting, abdominal pain, diarrhea, constipation, blood in stool and melena.  Genitourinary: Negative.   Musculoskeletal: Negative.   Skin: Negative.   Neurological: Negative for dizziness, tingling, focal weakness, seizures, weakness and headaches.  Endo/Heme/Allergies: Does not bruise/bleed easily.  Psychiatric/Behavioral: Negative for depression. The patient is not nervous/anxious and does not have insomnia.     As per HPI. Otherwise, a complete review of systems is negatve.  ONCOLOGY HISTORY: Oncology History   1. Invasive lobular carcinoma (June 2 012).  All margins are positive.  Approximate sizes more than 3 cm. size is  approximately 4.5. after re-resection(July, 2012) 2. Previous history of carcinoma of breast in July of 1988, invasive ductal carcinoma in the left breast, status post modified radical mastectomy 3. Prophylactic subcutaneous mastectomy on the right side.  Multifocal ductal carcinoma in situ present. 4. Patient had reconstructive surgery on both sides.(1988) 5. Oncotype DX score (August, 2012) been low risk.  Score of 14. 6. Radiation therapy (right breast) August 2012. 7. Started on Arimidex from September, 2012     Breast cancer   11/20/1986 Initial Diagnosis DCIS- left breast   04/21/1987 Surgery Bilateral mastectomy with reconstruction   05/21/2011 Cancer Diagnosis Breast cancer - Right breast Invasive lobular carcinoma   05/21/2011 - 06/21/2011 Radiation Therapy    06/25/2011 -  Anti-estrogen oral therapy with Arimidex    PAST MEDICAL HISTORY: Past Medical History  Diagnosis Date  . MVP (mitral valve prolapse)   . Hypertension   . Breast cancer 1988    LT MASTECTOMY  . Breast cancer 1989    RT LUMPECTOMY  . Breast cancer 2012    RT LUMPECTOMY  . Breast cancer 07/22/2015  PAST SURGICAL HISTORY: Past Surgical History  Procedure Laterality Date  . Abdominal hysterectomy    . Breast reconstruction Bilateral   .  Augmentation mammaplasty Bilateral 1989  . Mastectomy Left 1988  . Mastectomy subcutaneous Right 1989  . Breast lumpectomy Right 1989    IN SITU FOUND DURING SUBCUTANEOUS MASTECTOMY  . Breast lumpectomy Right 2012    FAMILY HISTORY Family History  Problem Relation Age of Onset  . Breast cancer Sister   . Breast cancer Maternal Aunt   . Breast cancer Paternal Grandmother     GYNECOLOGIC HISTORY:  No LMP recorded. Patient has had a hysterectomy.     ADVANCED DIRECTIVES:    HEALTH MAINTENANCE: Social History  Substance Use Topics  . Smoking status: Former Research scientist (life sciences)  . Smokeless tobacco: Never Used  . Alcohol Use: Not on file     Colonoscopy:  PAP:  Bone density:  Lipid panel:  Allergies  Allergen Reactions  . Latex Rash    Current Outpatient Prescriptions  Medication Sig Dispense Refill  . ALPRAZolam (XANAX) 0.25 MG tablet Take 0.25 mg by mouth at bedtime as needed for anxiety.    Marland Kitchen anastrozole (ARIMIDEX) 1 MG tablet Take 1 mg by mouth daily.    . Ascorbic Acid (VITAMIN C) 1000 MG tablet Take 1,000 mg by mouth daily.    Marland Kitchen aspirin 81 MG tablet Take 81 mg by mouth daily.    . calcium carbonate (OS-CAL) 600 MG TABS tablet Take 600 mg by mouth 2 (two) times daily with a meal.    . diphenhydrAMINE (SOMINEX) 25 MG tablet Take 25 mg by mouth at bedtime as needed for sleep.    . metoprolol tartrate (LOPRESSOR) 25 MG tablet Take 25 mg by mouth 2 (two) times daily.     No current facility-administered medications for this visit.    OBJECTIVE: BP 163/89 mmHg  Pulse 80  Temp(Src) 96.4 F (35.8 C) (Tympanic)  Wt 146 lb 6 oz (66.395 kg)   Body mass index is 22.92 kg/(m^2).    ECOG FS:0 - Asymptomatic  General: Well-developed, well-nourished, no acute distress. Eyes: Pink conjunctiva, anicteric sclera. HEENT: Normocephalic, moist mucous membranes, clear oropharnyx. Lungs: Clear to auscultation bilaterally. Heart: Regular rate and rhythm. No rubs, murmurs, or  gallops. Abdomen: Soft, nontender, nondistended. No organomegaly noted, normoactive bowel sounds. Breast: Status post bilateral mastectomy with reconstruction. Breast palpated in a circular manner in the sitting and supine positions.  No masses or fullness palpated.  Axilla palpated in both positions with no masses or fullness palpated.  Musculoskeletal: No edema, cyanosis, or clubbing. Neuro: Alert, answering all questions appropriately. Cranial nerves grossly intact. Skin: No rashes or petechiae noted. Psych: Normal affect. Lymphatics: No cervical, calvicular, axillary or inguinal LAD.   LAB RESULTS:  Appointment on 07/22/2015  Component Date Value Ref Range Status  . WBC 07/22/2015 7.6  3.6 - 11.0 K/uL Final  . RBC 07/22/2015 4.68  3.80 - 5.20 MIL/uL Final  . Hemoglobin 07/22/2015 13.7  12.0 - 16.0 g/dL Final  . HCT 07/22/2015 40.6  35.0 - 47.0 % Final  . MCV 07/22/2015 86.7  80.0 - 100.0 fL Final  . MCH 07/22/2015 29.2  26.0 - 34.0 pg Final  . MCHC 07/22/2015 33.7  32.0 - 36.0 g/dL Final  . RDW 07/22/2015 13.6  11.5 - 14.5 % Final  . Platelets 07/22/2015 206  150 - 440 K/uL Final  . Neutrophils Relative % 07/22/2015 80   Final  . Neutro Abs 07/22/2015 6.1  1.4 - 6.5 K/uL Final  . Lymphocytes Relative 07/22/2015 11   Final  . Lymphs Abs 07/22/2015 0.8* 1.0 - 3.6 K/uL Final  . Monocytes Relative 07/22/2015 7   Final  . Monocytes Absolute 07/22/2015 0.5  0.2 - 0.9 K/uL Final  . Eosinophils Relative 07/22/2015 2   Final  . Eosinophils Absolute 07/22/2015 0.2  0 - 0.7 K/uL Final  . Basophils Relative 07/22/2015 0   Final  . Basophils Absolute 07/22/2015 0.0  0 - 0.1 K/uL Final  . Sodium 07/22/2015 133* 135 - 145 mmol/L Final  . Potassium 07/22/2015 4.0  3.5 - 5.1 mmol/L Final  . Chloride 07/22/2015 100* 101 - 111 mmol/L Final  . CO2 07/22/2015 24  22 - 32 mmol/L Final  . Glucose, Bld 07/22/2015 103* 65 - 99 mg/dL Final  . BUN 07/22/2015 15  6 - 20 mg/dL Final  . Creatinine, Ser  07/22/2015 0.66  0.44 - 1.00 mg/dL Final  . Calcium 07/22/2015 8.9  8.9 - 10.3 mg/dL Final  . Total Protein 07/22/2015 7.8  6.5 - 8.1 g/dL Final  . Albumin 07/22/2015 4.6  3.5 - 5.0 g/dL Final  . AST 07/22/2015 17  15 - 41 U/L Final  . ALT 07/22/2015 14  14 - 54 U/L Final  . Alkaline Phosphatase 07/22/2015 77  38 - 126 U/L Final  . Total Bilirubin 07/22/2015 0.8  0.3 - 1.2 mg/dL Final  . GFR calc non Af Amer 07/22/2015 >60  >60 mL/min Final  . GFR calc Af Amer 07/22/2015 >60  >60 mL/min Final   Comment: (NOTE) The eGFR has been calculated using the CKD EPI equation. This calculation has not been validated in all clinical situations. eGFR's persistently <60 mL/min signify possible Chronic Kidney Disease.   . Anion gap 07/22/2015 9  5 - 15 Final  . TSH 07/22/2015 2.319  0.350 - 4.500 uIU/mL Final  . Cholesterol 07/22/2015 229* 0 - 200 mg/dL Final  . Triglycerides 07/22/2015 40  <150 mg/dL Final  . HDL 07/22/2015 69  >40 mg/dL Final  . Total CHOL/HDL Ratio 07/22/2015 3.3   Final  . VLDL 07/22/2015 8  0 - 40 mg/dL Final  . LDL Cholesterol 07/22/2015 152* 0 - 99 mg/dL Final   Comment:        Total Cholesterol/HDL:CHD Risk Coronary Heart Disease Risk Table                     Men   Women  1/2 Average Risk   3.4   3.3  Average Risk       5.0   4.4  2 X Average Risk   9.6   7.1  3 X Average Risk  23.4   11.0        Use the calculated Patient Ratio above and the CHD Risk Table to determine the patient's CHD Risk.        ATP III CLASSIFICATION (LDL):  <100     mg/dL   Optimal  100-129  mg/dL   Near or Above                    Optimal  130-159  mg/dL   Borderline  160-189  mg/dL   High  >190     mg/dL   Very High     STUDIES: No results found.  ASSESSMENT:  Carcinoma of breast. (DCIS left breast in 1988, lobular carcinoma right breast 2012)  PLAN:   1.  Carcinoma of breast. T2  N0, M0 tumor, ER/ PR positive, HER2 negative. Diagnosed with lobular carcinoma, invasive tumor in  the right breast, 2012.  All the margins are positive. Reexcision performed with negative margins and 5 negative lymph nodes. She is status post bilateral mastectomy and reconstruction.  Clinically there is no evidence of recurrent disease. Mammogram was most recently performed in June 2016 and reported as negative. Patient had most recent bone density in 2016 and recommended follow up in 2018. Will continue with arimidex, calcium, and vitamin D.  2. Infiltrating ductal carcinoma of her left breast in 1988. Patient underwent subcutaneous mastectomy and reconstruction.  Patient expressed understanding and was in agreement with this plan. She also understands that She can call clinic at any time with any questions, concerns, or complaints.   Dr. Oliva Bustard was available for consultation and review of plan of care for this patient.   Evlyn Kanner, NP   07/22/2015 2:17 PM

## 2015-07-22 NOTE — Progress Notes (Signed)
Patient does have living will.  Former smoker. 

## 2015-07-24 DIAGNOSIS — Z9882 Breast implant status: Secondary | ICD-10-CM | POA: Diagnosis not present

## 2015-07-24 DIAGNOSIS — C50919 Malignant neoplasm of unspecified site of unspecified female breast: Secondary | ICD-10-CM | POA: Diagnosis not present

## 2015-07-24 DIAGNOSIS — I10 Essential (primary) hypertension: Secondary | ICD-10-CM | POA: Diagnosis not present

## 2015-07-24 DIAGNOSIS — M75120 Complete rotator cuff tear or rupture of unspecified shoulder, not specified as traumatic: Secondary | ICD-10-CM | POA: Diagnosis not present

## 2016-01-15 DIAGNOSIS — Z9882 Breast implant status: Secondary | ICD-10-CM | POA: Diagnosis not present

## 2016-01-15 DIAGNOSIS — M75120 Complete rotator cuff tear or rupture of unspecified shoulder, not specified as traumatic: Secondary | ICD-10-CM | POA: Diagnosis not present

## 2016-01-15 DIAGNOSIS — C50919 Malignant neoplasm of unspecified site of unspecified female breast: Secondary | ICD-10-CM | POA: Diagnosis not present

## 2016-01-18 ENCOUNTER — Ambulatory Visit: Payer: Medicare Other | Admitting: Oncology

## 2016-01-18 ENCOUNTER — Other Ambulatory Visit: Payer: Medicare Other

## 2016-01-20 ENCOUNTER — Other Ambulatory Visit: Payer: Medicare Other

## 2016-01-20 ENCOUNTER — Ambulatory Visit: Payer: Medicare Other | Admitting: Oncology

## 2016-03-03 ENCOUNTER — Ambulatory Visit: Payer: Medicare Other | Admitting: Oncology

## 2016-03-03 ENCOUNTER — Other Ambulatory Visit: Payer: Medicare Other

## 2016-03-07 DIAGNOSIS — I781 Nevus, non-neoplastic: Secondary | ICD-10-CM | POA: Diagnosis not present

## 2016-03-07 DIAGNOSIS — L821 Other seborrheic keratosis: Secondary | ICD-10-CM | POA: Diagnosis not present

## 2016-03-07 DIAGNOSIS — L578 Other skin changes due to chronic exposure to nonionizing radiation: Secondary | ICD-10-CM | POA: Diagnosis not present

## 2016-03-07 DIAGNOSIS — D225 Melanocytic nevi of trunk: Secondary | ICD-10-CM | POA: Diagnosis not present

## 2016-03-07 DIAGNOSIS — Z1283 Encounter for screening for malignant neoplasm of skin: Secondary | ICD-10-CM | POA: Diagnosis not present

## 2016-03-07 DIAGNOSIS — L858 Other specified epidermal thickening: Secondary | ICD-10-CM | POA: Diagnosis not present

## 2016-03-07 DIAGNOSIS — D485 Neoplasm of uncertain behavior of skin: Secondary | ICD-10-CM | POA: Diagnosis not present

## 2016-03-07 DIAGNOSIS — I8393 Asymptomatic varicose veins of bilateral lower extremities: Secondary | ICD-10-CM | POA: Diagnosis not present

## 2016-03-07 DIAGNOSIS — Z853 Personal history of malignant neoplasm of breast: Secondary | ICD-10-CM | POA: Diagnosis not present

## 2016-03-07 DIAGNOSIS — L720 Epidermal cyst: Secondary | ICD-10-CM | POA: Diagnosis not present

## 2016-03-07 DIAGNOSIS — L812 Freckles: Secondary | ICD-10-CM | POA: Diagnosis not present

## 2016-03-07 DIAGNOSIS — D1801 Hemangioma of skin and subcutaneous tissue: Secondary | ICD-10-CM | POA: Diagnosis not present

## 2016-03-07 DIAGNOSIS — D229 Melanocytic nevi, unspecified: Secondary | ICD-10-CM | POA: Diagnosis not present

## 2016-03-14 ENCOUNTER — Ambulatory Visit: Payer: Medicare Other | Admitting: Family Medicine

## 2016-03-14 ENCOUNTER — Other Ambulatory Visit: Payer: Medicare Other

## 2016-03-17 ENCOUNTER — Ambulatory Visit: Payer: Medicare Other | Admitting: Oncology

## 2016-03-17 ENCOUNTER — Other Ambulatory Visit: Payer: Medicare Other

## 2016-03-18 ENCOUNTER — Ambulatory Visit
Admission: RE | Admit: 2016-03-18 | Discharge: 2016-03-18 | Disposition: A | Discharge status: Home / Self Care | Payer: Medicare Other | Source: Ambulatory Visit | Attending: Radiation Oncology | Admitting: Radiation Oncology

## 2016-03-18 ENCOUNTER — Encounter: Payer: Self-pay | Admitting: Radiation Oncology

## 2016-03-18 VITALS — BP 134/87 | HR 59 | Temp 98.0°F | Wt 139.2 lb

## 2016-03-18 DIAGNOSIS — C50911 Malignant neoplasm of unspecified site of right female breast: Secondary | ICD-10-CM

## 2016-03-18 DIAGNOSIS — Z853 Personal history of malignant neoplasm of breast: Secondary | ICD-10-CM | POA: Insufficient documentation

## 2016-03-18 DIAGNOSIS — Z923 Personal history of irradiation: Secondary | ICD-10-CM | POA: Diagnosis not present

## 2016-03-18 NOTE — Progress Notes (Signed)
Radiation Oncology Follow up Note  Name: Deborah Trevino   Date:   03/18/2016 MRN:  [id number] DOB: 11-Sep-1946    This 70 y.o. female presents to the clinic today for follow-up for invasive lobular carcinoma the right breast now out for half years.  REFERRING PROVIDER: Cletis Athens, MD  HPI: Patient is a 70 year old female now out for half years completing radiation therapy to her right breast after resection of a 4.5 cm lesion. She status post modified radical mastectomy and had prophylactic subcutaneous mastectomy in the right with multifocal ductal carcinoma in situ. She is seen today in routine follow-up and is doing well. She currently is on room and asked following that well without side effect. Follow-up scans have been fine. She specifically denies breast tenderness cough or bone pain..  COMPLICATIONS OF TREATMENT: none  FOLLOW UP COMPLIANCE: keeps appointments   PHYSICAL EXAM:  BP 134/87 mmHg  Pulse 59  Temp(Src) 98 F (36.7 C)  Wt 139 lb 3.5 oz (63.15 kg) Patient is status post bilateral breast reconstruction. No dominant mass or nodularity is noted in either breast. No axillary or supraclavicular adenopathy is appreciated. Well-developed well-nourished patient in NAD. HEENT reveals PERLA, EOMI, discs not visualized.  Oral cavity is clear. No oral mucosal lesions are identified. Neck is clear without evidence of cervical or supraclavicular adenopathy. Lungs are clear to A&P. Cardiac examination is essentially unremarkable with regular rate and rhythm without murmur rub or thrill. Abdomen is benign with no organomegaly or masses noted. Motor sensory and DTR levels are equal and symmetric in the upper and lower extremities. Cranial nerves II through XII are grossly intact. Proprioception is intact. No peripheral adenopathy or edema is identified. No motor or sensory levels are noted. Crude visual fields are within normal range.  RADIOLOGY RESULTS: Follow-up unilateral mammogram  of the right shows no evidence of disease perform back in June 2016  PLAN: At the present time she continues to do well with no evidence of disease now close to 5 years out. I'm going to discontinue follow-up care at this point. Patient is done extremely well. Patient knows to call with any concerns.  I would like to take this opportunity to thank you for allowing me to participate in the care of your patient.Armstead Peaks., MD

## 2016-03-22 ENCOUNTER — Encounter: Payer: Self-pay | Admitting: Family Medicine

## 2016-03-22 ENCOUNTER — Other Ambulatory Visit: Payer: Self-pay | Admitting: *Deleted

## 2016-03-22 ENCOUNTER — Other Ambulatory Visit: Payer: Self-pay | Admitting: Family Medicine

## 2016-03-22 ENCOUNTER — Inpatient Hospital Stay: Payer: Medicare Other | Attending: Family Medicine | Admitting: Family Medicine

## 2016-03-22 ENCOUNTER — Inpatient Hospital Stay: Payer: Medicare Other

## 2016-03-22 VITALS — BP 161/97 | HR 62 | Temp 94.2°F | Resp 18 | Ht 67.0 in | Wt 138.9 lb

## 2016-03-22 DIAGNOSIS — C50912 Malignant neoplasm of unspecified site of left female breast: Secondary | ICD-10-CM

## 2016-03-22 DIAGNOSIS — I10 Essential (primary) hypertension: Secondary | ICD-10-CM | POA: Diagnosis not present

## 2016-03-22 DIAGNOSIS — Z87891 Personal history of nicotine dependence: Secondary | ICD-10-CM | POA: Diagnosis not present

## 2016-03-22 DIAGNOSIS — Z170 Estrogen receptor positive status [ER+]: Secondary | ICD-10-CM | POA: Diagnosis not present

## 2016-03-22 DIAGNOSIS — I341 Nonrheumatic mitral (valve) prolapse: Secondary | ICD-10-CM | POA: Diagnosis not present

## 2016-03-22 DIAGNOSIS — Z79899 Other long term (current) drug therapy: Secondary | ICD-10-CM | POA: Diagnosis not present

## 2016-03-22 DIAGNOSIS — Z9013 Acquired absence of bilateral breasts and nipples: Secondary | ICD-10-CM

## 2016-03-22 DIAGNOSIS — Z7982 Long term (current) use of aspirin: Secondary | ICD-10-CM | POA: Diagnosis not present

## 2016-03-22 DIAGNOSIS — C50919 Malignant neoplasm of unspecified site of unspecified female breast: Secondary | ICD-10-CM

## 2016-03-22 DIAGNOSIS — Z923 Personal history of irradiation: Secondary | ICD-10-CM | POA: Diagnosis not present

## 2016-03-22 LAB — CBC WITH DIFFERENTIAL/PLATELET
BASOS PCT: 1 %
Basophils Absolute: 0 10*3/uL (ref 0–0.1)
Eosinophils Absolute: 0.5 10*3/uL (ref 0–0.7)
Eosinophils Relative: 7 %
HEMATOCRIT: 40.7 % (ref 35.0–47.0)
HEMOGLOBIN: 13.9 g/dL (ref 12.0–16.0)
LYMPHS ABS: 1.3 10*3/uL (ref 1.0–3.6)
Lymphocytes Relative: 21 %
MCH: 29.9 pg (ref 26.0–34.0)
MCHC: 34.2 g/dL (ref 32.0–36.0)
MCV: 87.5 fL (ref 80.0–100.0)
MONO ABS: 0.4 10*3/uL (ref 0.2–0.9)
Monocytes Relative: 7 %
NEUTROS ABS: 4 10*3/uL (ref 1.4–6.5)
NEUTROS PCT: 64 %
Platelets: 202 10*3/uL (ref 150–440)
RBC: 4.66 MIL/uL (ref 3.80–5.20)
RDW: 14.1 % (ref 11.5–14.5)
WBC: 6.3 10*3/uL (ref 3.6–11.0)

## 2016-03-22 LAB — COMPREHENSIVE METABOLIC PANEL
ALK PHOS: 71 U/L (ref 38–126)
ALT: 14 U/L (ref 14–54)
ANION GAP: 8 (ref 5–15)
AST: 19 U/L (ref 15–41)
Albumin: 4.7 g/dL (ref 3.5–5.0)
BILIRUBIN TOTAL: 0.8 mg/dL (ref 0.3–1.2)
BUN: 18 mg/dL (ref 6–20)
CALCIUM: 9.4 mg/dL (ref 8.9–10.3)
CO2: 26 mmol/L (ref 22–32)
Chloride: 102 mmol/L (ref 101–111)
Creatinine, Ser: 0.63 mg/dL (ref 0.44–1.00)
Glucose, Bld: 108 mg/dL — ABNORMAL HIGH (ref 65–99)
Potassium: 4.3 mmol/L (ref 3.5–5.1)
Sodium: 136 mmol/L (ref 135–145)
Total Protein: 7.6 g/dL (ref 6.5–8.1)

## 2016-03-22 MED ORDER — ALPRAZOLAM 0.25 MG PO TABS: 0.2500 mg | ORAL_TABLET | Freq: Every evening | ORAL | Status: DC | PRN

## 2016-03-22 MED ORDER — ANASTROZOLE 1 MG PO TABS: 1.0000 mg | ORAL_TABLET | Freq: Every day | ORAL | Status: DC

## 2016-03-22 NOTE — Progress Notes (Signed)
[place]  Telephone:([phone]) [phone]  Fax:(336) [phone]     Deborah Trevino DOB: 1946-02-05  MR#: [id number]  WCB#:[id number]  Patient Care Team: Cletis Athens, MD as PCP - General (Internal Medicine)  CHIEF COMPLAINT:  Chief Complaint  Patient presents with  . Follow-up    Breast Cancer   Patient with significant past medical history for Left sided invasive ductal carcinoma in the left breast, status post modified radical mastectomy in 1988 as well as Invasive lobular carcinoma in June 2012.   INTERVAL HISTORY:  Patient is here for further evaluation and treatment consideration regarding breast cancer. She is status post excision for infiltrating lobular carcinoma with positive margins, XRT of right breast for a T2, stage II infiltrating lobular carcinoma. She has had subcutaneous mastectomies in the past with reconstruction in 1988. Reexcision margins were negative and 5 nodes were negative. She is currently on Arimidex and tolerating well. She continues with diet and exercise for weight loss. She states that this has improved joint pain and aches. Last mammogram was performed in June 2016. Patient reports monthly self breast exams and denies any abnormalities. She otherwise feels very well and denies any other complaints.  REVIEW OF SYSTEMS:   Review of Systems  Constitutional: Negative for fever, chills, weight loss, malaise/fatigue and diaphoresis.       Intentional weight loss r/t to diet and exercise  HENT: Negative for congestion, ear discharge, ear pain, hearing loss, nosebleeds, sore throat and tinnitus.   Eyes: Negative for blurred vision, double vision, photophobia, pain, discharge and redness.  Respiratory: Negative for cough, hemoptysis, sputum production, shortness of breath, wheezing and stridor.   Cardiovascular: Negative for chest pain, palpitations, orthopnea, claudication, leg swelling and PND.  Gastrointestinal: Negative for heartburn, nausea,  vomiting, abdominal pain, diarrhea, constipation, blood in stool and melena.  Genitourinary: Negative.   Musculoskeletal: Negative.   Skin: Negative.   Neurological: Negative for dizziness, tingling, focal weakness, seizures, weakness and headaches.  Endo/Heme/Allergies: Does not bruise/bleed easily.  Psychiatric/Behavioral: Negative for depression. The patient is not nervous/anxious and does not have insomnia.     As per HPI. Otherwise, a complete review of systems is negatve.  ONCOLOGY HISTORY: Oncology History   1. Invasive lobular carcinoma (June 2 012).  All margins are positive.  Approximate sizes more than 3 cm. size is  approximately 4.5. after re-resection(July, 2012) 2. Previous history of carcinoma of breast in July of 1988, invasive ductal carcinoma in the left breast, status post modified radical mastectomy 3. Prophylactic subcutaneous mastectomy on the right side.  Multifocal ductal carcinoma in situ present. 4. Patient had reconstructive surgery on both sides.(1988) 5. Oncotype DX score (August, 2012) been low risk.  Score of 14. 6. Radiation therapy (right breast) August 2012. 7. Started on Arimidex from September, 2012     Breast cancer [facility])   11/20/1986 Initial Diagnosis DCIS- left breast   04/21/1987 Surgery Bilateral mastectomy with reconstruction   05/21/2011 Cancer Diagnosis Breast cancer - Right breast Invasive lobular carcinoma   05/21/2011 - 06/21/2011 Radiation Therapy    06/25/2011 -  Anti-estrogen oral therapy with Arimidex    PAST MEDICAL HISTORY: Past Medical History  Diagnosis Date  . MVP (mitral valve prolapse)   . Hypertension   . Breast cancer ([place]) 1988    LT MASTECTOMY  . Breast cancer ([place]) 1989    RT LUMPECTOMY  . Breast cancer ([place]) 2012    RT LUMPECTOMY  . Breast cancer ([place]) 07/22/2015  PAST SURGICAL HISTORY: Past Surgical History  Procedure Laterality Date  . Abdominal hysterectomy    . Breast reconstruction Bilateral   .  Augmentation mammaplasty Bilateral 1989  . Mastectomy Left 1988  . Mastectomy subcutaneous Right 1989  . Breast lumpectomy Right 1989    IN SITU FOUND DURING SUBCUTANEOUS MASTECTOMY  . Breast lumpectomy Right 2012    FAMILY HISTORY Family History  Problem Relation Age of Onset  . Breast cancer Sister   . Breast cancer Maternal Aunt   . Breast cancer Paternal Grandmother     GYNECOLOGIC HISTORY:  No LMP recorded. Patient has had a hysterectomy.     ADVANCED DIRECTIVES:    HEALTH MAINTENANCE: Social History  Substance Use Topics  . Smoking status: Former Research scientist (life sciences)  . Smokeless tobacco: Never Used  . Alcohol Use: 1.2 oz/week    0 Standard drinks or equivalent, 2 Glasses of wine per week     Colonoscopy:  PAP:  Bone density: 03/2015  Mammogram: 03/2015  Allergies  Allergen Reactions  . Latex Rash    Current Outpatient Prescriptions  Medication Sig Dispense Refill  . ALPRAZolam (XANAX) 0.25 MG tablet Take 1 tablet (0.25 mg total) by mouth at bedtime as needed for anxiety. 30 tablet 0  . anastrozole (ARIMIDEX) 1 MG tablet Take 1 tablet (1 mg total) by mouth daily. 90 tablet 1  . Ascorbic Acid (VITAMIN C) 1000 MG tablet Take 1,000 mg by mouth daily.    Marland Kitchen aspirin 81 MG tablet Take 81 mg by mouth daily.    . calcium carbonate (OS-CAL) 600 MG TABS tablet Take 600 mg by mouth 2 (two) times daily with a meal.    . metoprolol tartrate (LOPRESSOR) 25 MG tablet Take 25 mg by mouth 2 (two) times daily.     No current facility-administered medications for this visit.    OBJECTIVE: BP 161/97 mmHg  Pulse 62  Temp(Src) 94.2 F (34.6 C) (Tympanic)  Resp 18  Ht _0  (1.702 m)  Wt 138 lb 14.2 oz (63 kg)  BMI 21.75 kg/m2   Body mass index is 21.75 kg/(m^2).    ECOG FS:0 - Asymptomatic  General: Well-developed, well-nourished, no acute distress. Eyes: Pink conjunctiva, anicteric sclera. HEENT: Normocephalic, moist mucous membranes, clear oropharnyx. Lungs: Clear to auscultation  bilaterally. Heart: Regular rate and rhythm. No rubs, murmurs, or gallops. Abdomen: Soft, nontender, nondistended. No organomegaly noted, normoactive bowel sounds. Breast: Status post bilateral mastectomy with reconstruction. Breast palpated in a circular manner in the sitting and supine positions.  No masses or fullness palpated.  Axilla palpated in both positions with no masses or fullness palpated.  Musculoskeletal: No edema, cyanosis, or clubbing. Neuro: Alert, answering all questions appropriately. Cranial nerves grossly intact. Skin: No rashes or petechiae noted. Psych: Normal affect. Lymphatics: No cervical, calvicular, axillary or inguinal LAD.   LAB RESULTS:  Appointment on 03/22/2016  Component Date Value Ref Range Status  . WBC 03/22/2016 6.3  3.6 - 11.0 K/uL Final  . RBC 03/22/2016 4.66  3.80 - 5.20 MIL/uL Final  . Hemoglobin 03/22/2016 13.9  12.0 - 16.0 g/dL Final  . HCT 03/22/2016 40.7  35.0 - 47.0 % Final  . MCV 03/22/2016 87.5  80.0 - 100.0 fL Final  . MCH 03/22/2016 29.9  26.0 - 34.0 pg Final  . MCHC 03/22/2016 34.2  32.0 - 36.0 g/dL Final  . RDW 03/22/2016 14.1  11.5 - 14.5 % Final  . Platelets 03/22/2016 202  150 - 440 K/uL Final  .  Neutrophils Relative % 03/22/2016 64   Final  . Neutro Abs 03/22/2016 4.0  1.4 - 6.5 K/uL Final  . Lymphocytes Relative 03/22/2016 21   Final  . Lymphs Abs 03/22/2016 1.3  1.0 - 3.6 K/uL Final  . Monocytes Relative 03/22/2016 7   Final  . Monocytes Absolute 03/22/2016 0.4  0.2 - 0.9 K/uL Final  . Eosinophils Relative 03/22/2016 7   Final  . Eosinophils Absolute 03/22/2016 0.5  0 - 0.7 K/uL Final  . Basophils Relative 03/22/2016 1   Final  . Basophils Absolute 03/22/2016 0.0  0 - 0.1 K/uL Final  . Sodium 03/22/2016 136  135 - 145 mmol/L Final  . Potassium 03/22/2016 4.3  3.5 - 5.1 mmol/L Final  . Chloride 03/22/2016 102  101 - 111 mmol/L Final  . CO2 03/22/2016 26  22 - 32 mmol/L Final  . Glucose, Bld 03/22/2016 108* 65 - 99 mg/dL  Final  . BUN 03/22/2016 18  6 - 20 mg/dL Final  . Creatinine, Ser 03/22/2016 0.63  0.44 - 1.00 mg/dL Final  . Calcium 03/22/2016 9.4  8.9 - 10.3 mg/dL Final  . Total Protein 03/22/2016 7.6  6.5 - 8.1 g/dL Final  . Albumin 03/22/2016 4.7  3.5 - 5.0 g/dL Final  . AST 03/22/2016 19  15 - 41 U/L Final  . ALT 03/22/2016 14  14 - 54 U/L Final  . Alkaline Phosphatase 03/22/2016 71  38 - 126 U/L Final  . Total Bilirubin 03/22/2016 0.8  0.3 - 1.2 mg/dL Final  . GFR calc non Af Amer 03/22/2016 >60  >60 mL/min Final  . GFR calc Af Amer 03/22/2016 >60  >60 mL/min Final   Comment: (NOTE) The eGFR has been calculated using the CKD EPI equation. This calculation has not been validated in all clinical situations. eGFR's persistently <60 mL/min signify possible Chronic Kidney Disease.   . Anion gap 03/22/2016 8  5 - 15 Final    STUDIES: No results found.  ASSESSMENT:  Carcinoma of breast. (DCIS left breast in 1988, lobular carcinoma right breast 2012)  PLAN:   1. Carcinoma of breast. T2  N0, M0 tumor, ER/ PR positive, HER2 negative. Diagnosed with lobular carcinoma, invasive tumor in the right breast, 2012.  All the margins are positive. Reexcision performed with negative margins and 5 negative lymph nodes. She is status post bilateral mastectomy and reconstruction.  Clinically there is no evidence of recurrent disease. Mammogram was most recently performed in June 2016 and reported as negative. Will schedule mammogram for Jun/ July 2017.  Patient had most recent bone density in 2016 and recommended follow up in 2018. Will continue with arimidex, calcium, and vitamin D.  2. Infiltrating ductal carcinoma of her left breast in 1988. Patient underwent subcutaneous mastectomy and reconstruction.  Will continue with routine follow up in 6 months.  Patient expressed understanding and was in agreement with this plan. She also understands that She can call clinic at any time with any questions, concerns,  or complaints.   Dr. Grayland Ormond was available for consultation and review of plan of care for this patient.  Evlyn Kanner, NP   03/22/2016 12:16 PM

## 2016-03-22 NOTE — Progress Notes (Signed)
Pt reports no changes since last visit other than trying to lose weight successfully.  Pt would like to know if she should expect and changes in medications because of being at 5 year mark.

## 2016-03-23 LAB — CANCER ANTIGEN 27.29: CA 27.29: 11 U/mL (ref 0.0–38.6)

## 2016-04-20 ENCOUNTER — Other Ambulatory Visit: Payer: Self-pay | Admitting: Family Medicine

## 2016-04-20 ENCOUNTER — Ambulatory Visit
Admission: RE | Admit: 2016-04-20 | Discharge: 2016-04-20 | Disposition: A | Discharge status: Home / Self Care | Payer: Medicare Other | Source: Ambulatory Visit | Attending: Family Medicine | Admitting: Family Medicine

## 2016-04-20 DIAGNOSIS — C50919 Malignant neoplasm of unspecified site of unspecified female breast: Secondary | ICD-10-CM

## 2016-04-20 DIAGNOSIS — C50912 Malignant neoplasm of unspecified site of left female breast: Secondary | ICD-10-CM

## 2016-04-20 DIAGNOSIS — R928 Other abnormal and inconclusive findings on diagnostic imaging of breast: Secondary | ICD-10-CM | POA: Diagnosis not present

## 2016-06-24 ENCOUNTER — Other Ambulatory Visit: Payer: Self-pay

## 2016-09-22 ENCOUNTER — Ambulatory Visit: Payer: Medicare Other | Admitting: Family Medicine

## 2016-09-22 ENCOUNTER — Other Ambulatory Visit: Payer: Medicare Other

## 2016-09-22 ENCOUNTER — Ambulatory Visit: Payer: Medicare Other

## 2016-09-23 ENCOUNTER — Inpatient Hospital Stay: Payer: Medicare Other

## 2016-09-26 ENCOUNTER — Other Ambulatory Visit: Payer: Self-pay | Admitting: *Deleted

## 2016-09-26 MED ORDER — ANASTROZOLE 1 MG PO TABS: 1.0000 mg | ORAL_TABLET | Freq: Every day | ORAL | 0 refills | Status: DC

## 2016-10-14 ENCOUNTER — Inpatient Hospital Stay (HOSPITAL_BASED_OUTPATIENT_CLINIC_OR_DEPARTMENT_OTHER): Payer: Medicare Other | Admitting: Oncology

## 2016-10-14 ENCOUNTER — Encounter: Payer: Self-pay | Admitting: Oncology

## 2016-10-14 ENCOUNTER — Inpatient Hospital Stay: Payer: Medicare Other | Attending: Oncology

## 2016-10-14 VITALS — BP 162/86 | HR 65 | Temp 97.2°F | Resp 18 | Wt 145.0 lb

## 2016-10-14 DIAGNOSIS — Z79899 Other long term (current) drug therapy: Secondary | ICD-10-CM | POA: Insufficient documentation

## 2016-10-14 DIAGNOSIS — C50912 Malignant neoplasm of unspecified site of left female breast: Secondary | ICD-10-CM | POA: Diagnosis not present

## 2016-10-14 DIAGNOSIS — Z923 Personal history of irradiation: Secondary | ICD-10-CM

## 2016-10-14 DIAGNOSIS — Z7982 Long term (current) use of aspirin: Secondary | ICD-10-CM | POA: Diagnosis not present

## 2016-10-14 DIAGNOSIS — I10 Essential (primary) hypertension: Secondary | ICD-10-CM | POA: Insufficient documentation

## 2016-10-14 DIAGNOSIS — Z9013 Acquired absence of bilateral breasts and nipples: Secondary | ICD-10-CM | POA: Insufficient documentation

## 2016-10-14 DIAGNOSIS — Z87891 Personal history of nicotine dependence: Secondary | ICD-10-CM | POA: Insufficient documentation

## 2016-10-14 DIAGNOSIS — M816 Localized osteoporosis [Lequesne]: Secondary | ICD-10-CM | POA: Insufficient documentation

## 2016-10-14 DIAGNOSIS — I341 Nonrheumatic mitral (valve) prolapse: Secondary | ICD-10-CM | POA: Diagnosis not present

## 2016-10-14 DIAGNOSIS — Z170 Estrogen receptor positive status [ER+]: Secondary | ICD-10-CM | POA: Diagnosis not present

## 2016-10-14 DIAGNOSIS — C50919 Malignant neoplasm of unspecified site of unspecified female breast: Secondary | ICD-10-CM

## 2016-10-14 LAB — COMPREHENSIVE METABOLIC PANEL
ALT: 18 U/L (ref 14–54)
ANION GAP: 7 (ref 5–15)
AST: 21 U/L (ref 15–41)
Albumin: 4.2 g/dL (ref 3.5–5.0)
Alkaline Phosphatase: 61 U/L (ref 38–126)
BILIRUBIN TOTAL: 0.6 mg/dL (ref 0.3–1.2)
BUN: 18 mg/dL (ref 6–20)
CHLORIDE: 102 mmol/L (ref 101–111)
CO2: 26 mmol/L (ref 22–32)
Calcium: 9.1 mg/dL (ref 8.9–10.3)
Creatinine, Ser: 0.52 mg/dL (ref 0.44–1.00)
Glucose, Bld: 101 mg/dL — ABNORMAL HIGH (ref 65–99)
POTASSIUM: 4.1 mmol/L (ref 3.5–5.1)
Sodium: 135 mmol/L (ref 135–145)
TOTAL PROTEIN: 7.2 g/dL (ref 6.5–8.1)

## 2016-10-14 LAB — CBC WITH DIFFERENTIAL/PLATELET
BASOS ABS: 0 10*3/uL (ref 0–0.1)
Basophils Relative: 1 %
EOS PCT: 11 %
Eosinophils Absolute: 0.6 10*3/uL (ref 0–0.7)
HEMATOCRIT: 38.5 % (ref 35.0–47.0)
Hemoglobin: 13.1 g/dL (ref 12.0–16.0)
LYMPHS ABS: 1.2 10*3/uL (ref 1.0–3.6)
LYMPHS PCT: 20 %
MCH: 29.9 pg (ref 26.0–34.0)
MCHC: 34.1 g/dL (ref 32.0–36.0)
MCV: 87.4 fL (ref 80.0–100.0)
MONO ABS: 0.4 10*3/uL (ref 0.2–0.9)
MONOS PCT: 7 %
NEUTROS ABS: 3.6 10*3/uL (ref 1.4–6.5)
Neutrophils Relative %: 61 %
PLATELETS: 182 10*3/uL (ref 150–440)
RBC: 4.4 MIL/uL (ref 3.80–5.20)
RDW: 14.3 % (ref 11.5–14.5)
WBC: 5.9 10*3/uL (ref 3.6–11.0)

## 2016-10-14 MED ORDER — TAMOXIFEN CITRATE 20 MG PO TABS: 20.0000 mg | ORAL_TABLET | Freq: Every day | ORAL | 3 refills | Status: DC

## 2016-10-14 NOTE — Progress Notes (Signed)
Hematology/Oncology Consult note [facility]  [phone] [phone]  Patient Care Team: Cletis Athens, MD as PCP - General (Internal Medicine)   Name of the patient: Deborah Trevino  [id number]  1946/03/06   Date of visit: 10/14/16  Diagnosis- history of invasive ductal carcinoma of the left breast in 1988 status post modified radical mastectomy. Invasive lobular carcinoma stage II in 2012 and is currently on Arimidex  Chief complaint/ Reason for visit- Routine follow-up of breast cancer  Heme/Onc history:  1. Invasive lobular carcinoma (June 2 012).  All margins are positive.  Approximate sizes more than 3 cm. size is  approximately 4.5. after re-resection(July, 2012) 2. Previous history of carcinoma of breast in July of 1988, invasive ductal carcinoma in the left breast, status post modified radical mastectomy 3. Prophylactic subcutaneous mastectomy on the right side.  Multifocal ductal carcinoma in situ present. 4. Patient had reconstructive surgery on both sides.(1988) 5. Oncotype DX score (August, 2012) been low risk.  Score of 14. 6. Radiation therapy (right breast) August 2012. 7. Started on Arimidex from September, 2012      Final pathology of the second malignancy in the right breast was as follows: It was invasive lobular carcinoma at least 2 cm and may exceed 5 cm. No DCIS was present. Margins were positive and patient underwent wide reexcision. At that time 0 out of 5 lymph nodes were positive for malignancy. The tumor was at least 4.2 x 1.9 x 2.2 cm in size. Reexcision margins were clear  Last bone density scan from June 2016 revealed a T score of -3.3 at the AP spine consistent with osteoporosis  Interval history-  She is tolerating her Arimidex well and reports no significant arthralgias. Appetite has been good and she denies any unintentional weight loss.  ECOG PS- 0 Pain scale- 0 Opioid associated constipation-  no  Review of systems- Review of Systems  Constitutional: Negative for chills, fever, malaise/fatigue and weight loss.  HENT: Negative for congestion, ear discharge and nosebleeds.   Eyes: Negative for blurred vision.  Respiratory: Negative for cough, hemoptysis, sputum production, shortness of breath and wheezing.   Cardiovascular: Negative for chest pain, palpitations, orthopnea and claudication.  Gastrointestinal: Negative for abdominal pain, blood in stool, constipation, diarrhea, heartburn, melena, nausea and vomiting.  Genitourinary: Negative for dysuria, flank pain, frequency, hematuria and urgency.  Musculoskeletal: Negative for back pain, joint pain and myalgias.  Skin: Negative for rash.  Neurological: Negative for dizziness, tingling, focal weakness, seizures, weakness and headaches.  Endo/Heme/Allergies: Does not bruise/bleed easily.  Psychiatric/Behavioral: Negative for depression and suicidal ideas. The patient does not have insomnia.      Current treatment- Arimidex  Allergies  Allergen Reactions  . Latex Rash     Past Medical History:  Diagnosis Date  . Breast cancer ([place]) 1988   LT MASTECTOMY  . Breast cancer ([place]) 1989   RT LUMPECTOMY  . Breast cancer ([place]) 2012   RT LUMPECTOMY  . Hypertension   . MVP (mitral valve prolapse)      Past Surgical History:  Procedure Laterality Date  . ABDOMINAL HYSTERECTOMY    . AUGMENTATION MAMMAPLASTY Bilateral 1989  . BREAST LUMPECTOMY Right 1989   IN SITU FOUND DURING SUBCUTANEOUS MASTECTOMY  . BREAST LUMPECTOMY Right 2012  . BREAST RECONSTRUCTION Bilateral   . MASTECTOMY Left 1988   BREAST CA  . MASTECTOMY SUBCUTANEOUS Right 1989   BREAST CA FOUND    Social History   Social  History  . Marital status: Married    Spouse name: N/A  . Number of children: N/A  . Years of education: N/A   Occupational History  . Not on file.   Social History Main Topics  . Smoking status: Former Research scientist (life sciences)  . Smokeless  tobacco: Never Used  . Alcohol use 1.2 oz/week    2 Glasses of wine per week  . Drug use: Unknown  . Sexual activity: Not on file   Other Topics Concern  . Not on file   Social History Narrative  . No narrative on file    Family History  Problem Relation Age of Onset  . Breast cancer Sister   . Breast cancer Maternal Aunt   . Breast cancer Paternal Grandmother      Current Outpatient Prescriptions:  .  ALPRAZolam (XANAX) 0.25 MG tablet, Take 1 tablet (0.25 mg total) by mouth at bedtime as needed for anxiety., Disp: 30 tablet, Rfl: 0 .  anastrozole (ARIMIDEX) 1 MG tablet, Take 1 tablet (1 mg total) by mouth daily., Disp: 90 tablet, Rfl: 0 .  Ascorbic Acid (VITAMIN C) 1000 MG tablet, Take 1,000 mg by mouth daily., Disp: , Rfl:  .  aspirin 81 MG tablet, Take 81 mg by mouth daily., Disp: , Rfl:  .  calcium carbonate (OS-CAL) 600 MG TABS tablet, Take 600 mg by mouth 2 (two) times daily with a meal., Disp: , Rfl:  .  metoprolol tartrate (LOPRESSOR) 25 MG tablet, Take 25 mg by mouth 2 (two) times daily., Disp: , Rfl:   Physical exam:  Vitals:   10/14/16 1038  BP: (!) 162/86  Pulse: 65  Resp: 18  Temp: 97.2 F (36.2 C)  TempSrc: Tympanic  Weight: 144 lb 15.2 oz (65.8 kg)   Physical Exam  Constitutional: She is oriented to person, place, and time and well-developed, well-nourished, and in no distress.  HENT:  Head: Normocephalic and atraumatic.  Eyes: EOM are normal. Pupils are equal, round, and reactive to light.  Neck: Normal range of motion.  Cardiovascular: Normal rate, regular rhythm and normal heart sounds.   Pulmonary/Chest: Effort normal and breath sounds normal.  Abdominal: Soft. Bowel sounds are normal.  Neurological: She is alert and oriented to person, place, and time.  Skin: Skin is warm and dry.   Breast exam is performed in seated and lying down position. Patient is status post bilateral mastectomy with reconstruction. The implant edges are intact and there is  no evidence of any chest wall recurrence. No evidence of bilateral axillary adenopathy   CMP Latest Ref Rng & Units 03/22/2016  Glucose 65 - 99 mg/dL 108(H)  BUN 6 - 20 mg/dL 18  Creatinine 0.44 - 1.00 mg/dL 0.63  Sodium 135 - 145 mmol/L 136  Potassium 3.5 - 5.1 mmol/L 4.3  Chloride 101 - 111 mmol/L 102  CO2 22 - 32 mmol/L 26  Calcium 8.9 - 10.3 mg/dL 9.4  Total Protein 6.5 - 8.1 g/dL 7.6  Total Bilirubin 0.3 - 1.2 mg/dL 0.8  Alkaline Phos 38 - 126 U/L 71  AST 15 - 41 U/L 19  ALT 14 - 54 U/L 14   CBC Latest Ref Rng & Units 03/22/2016  WBC 3.6 - 11.0 K/uL 6.3  Hemoglobin 12.0 - 16.0 g/dL 13.9  Hematocrit 35.0 - 47.0 % 40.7  Platelets 150 - 440 K/uL 202     Assessment and plan- Patient is a 70 y.o. female with a history of left breast cancer in 1988 status  post bilateral mastectomy with reconstruction at that time. She probably had residual breast tissue in her right breast and had recurrent right breast invasive lobular carcinoma stage IIA T2 N0 cM0 ER/PR positive and HER-2/neu negative status post excision and adjuvant radiation therapy. She did not require adjuvant chemotherapy. She currently remains on Arimidex  1. Breast cancer- patient has completed 5 years of Arimidex treatment so far. Given that she had stage II breast cancer in a recurrent setting and she currently has a good performance status with no significant comorbidities; I'm inclined to continue her hormone therapy for a total period of 10 years. I reviewed her recent bone density scan from 2016 which showed osteoporosis. She is currently on calcium and vitamin D supplements. However I'm concerned that her osteoporosis can be worsened by continued use of Arimidex. Hence at this time I discussed the option of switching her to tamoxifen which improves bone health in postmenopausal women for a total period of 5 years. I discussed the risks and benefits of tamoxifen including all but not limited to fatigue, hot flashes, risk of  DVT and cataracts. Patient has already had a hysterectomy and is not an increased risk of uterine cancer. Patient understands the risks and benefits of switching to tamoxifen and is agreeable to switchi to tamoxifen at this time.  2. With regards to her osteoporosis, and I encouraged her to continue her exercise along with calcium and vitamin D but I did discuss the option of adding bisphosphonates at this time. I discussed the risks and benefits of oral bisphosphonate such as Fosamax as well as parenteral bisphosphonate such as Zometa which can be given once a year.   I discussed the small but serious side effect of osteonecrosis of the jaw which is particularly associated with parenteral bisphosphonates. I have given her written material about Fosamax. She will think about both these options and get back to [place] during her next visit. She has a dental appointment in February and will obtain dental clearance prior to starting parenteral bisphosphonates which we would not need for oral bisphosphonates. She will be due for her next bone density scan in 2018.   3. I will see her back in about 8 weeks time to see how she is tolerating her tamoxifen with labs. We will be continuing her yearly mammograms although she has had a bilateral mastectomy given recurrent right breast cancer from the residual breast tissue. Recent right breast mammogram from June 2017 did not reveal any evidence of malignancy    Total face to face encounter time for this patient visit was 30 min. >50% of the time was  spent in counseling and coordination of care.     Visit Diagnosis 1. Malignant neoplasm of left breast in female, estrogen receptor positive, unspecified site of breast ([place])   2. Localized osteoporosis without current pathological fracture      Dr. Randa Evens, MD, MPH [facility] at [facility] Pager- [phone] 10/14/2016 8:42 AM

## 2016-10-14 NOTE — Progress Notes (Signed)
Patient is here for follow up, she is doing well. She would like to know can she stop taking the oral chemo?

## 2016-10-27 DIAGNOSIS — I10 Essential (primary) hypertension: Secondary | ICD-10-CM | POA: Diagnosis not present

## 2016-10-27 DIAGNOSIS — R002 Palpitations: Secondary | ICD-10-CM | POA: Diagnosis not present

## 2016-10-27 DIAGNOSIS — M810 Age-related osteoporosis without current pathological fracture: Secondary | ICD-10-CM | POA: Diagnosis not present

## 2016-10-28 DIAGNOSIS — H43813 Vitreous degeneration, bilateral: Secondary | ICD-10-CM | POA: Diagnosis not present

## 2016-11-16 DIAGNOSIS — H25813 Combined forms of age-related cataract, bilateral: Secondary | ICD-10-CM | POA: Diagnosis not present

## 2016-12-16 ENCOUNTER — Other Ambulatory Visit: Payer: Self-pay | Admitting: *Deleted

## 2016-12-16 ENCOUNTER — Inpatient Hospital Stay: Payer: Medicare Other

## 2016-12-16 ENCOUNTER — Inpatient Hospital Stay: Payer: Medicare Other | Attending: Oncology | Admitting: Oncology

## 2016-12-16 VITALS — BP 151/81 | HR 54 | Temp 96.2°F | Resp 18 | Wt 141.9 lb

## 2016-12-16 DIAGNOSIS — Z170 Estrogen receptor positive status [ER+]: Principal | ICD-10-CM

## 2016-12-16 DIAGNOSIS — I10 Essential (primary) hypertension: Secondary | ICD-10-CM | POA: Diagnosis not present

## 2016-12-16 DIAGNOSIS — Z79899 Other long term (current) drug therapy: Secondary | ICD-10-CM | POA: Insufficient documentation

## 2016-12-16 DIAGNOSIS — Z9013 Acquired absence of bilateral breasts and nipples: Secondary | ICD-10-CM | POA: Insufficient documentation

## 2016-12-16 DIAGNOSIS — Z79810 Long term (current) use of selective estrogen receptor modulators (SERMs): Secondary | ICD-10-CM | POA: Insufficient documentation

## 2016-12-16 DIAGNOSIS — M810 Age-related osteoporosis without current pathological fracture: Secondary | ICD-10-CM

## 2016-12-16 DIAGNOSIS — Z853 Personal history of malignant neoplasm of breast: Secondary | ICD-10-CM

## 2016-12-16 DIAGNOSIS — Z923 Personal history of irradiation: Secondary | ICD-10-CM | POA: Diagnosis not present

## 2016-12-16 DIAGNOSIS — Z90710 Acquired absence of both cervix and uterus: Secondary | ICD-10-CM | POA: Diagnosis not present

## 2016-12-16 DIAGNOSIS — Z7982 Long term (current) use of aspirin: Secondary | ICD-10-CM | POA: Insufficient documentation

## 2016-12-16 DIAGNOSIS — C50912 Malignant neoplasm of unspecified site of left female breast: Secondary | ICD-10-CM

## 2016-12-16 DIAGNOSIS — I341 Nonrheumatic mitral (valve) prolapse: Secondary | ICD-10-CM | POA: Diagnosis not present

## 2016-12-16 DIAGNOSIS — F419 Anxiety disorder, unspecified: Secondary | ICD-10-CM | POA: Insufficient documentation

## 2016-12-16 DIAGNOSIS — Z87891 Personal history of nicotine dependence: Secondary | ICD-10-CM | POA: Insufficient documentation

## 2016-12-16 LAB — COMPREHENSIVE METABOLIC PANEL
ALT: 12 U/L — AB (ref 14–54)
AST: 18 U/L (ref 15–41)
Albumin: 4.3 g/dL (ref 3.5–5.0)
Alkaline Phosphatase: 48 U/L (ref 38–126)
Anion gap: 7 (ref 5–15)
BUN: 17 mg/dL (ref 6–20)
CHLORIDE: 102 mmol/L (ref 101–111)
CO2: 25 mmol/L (ref 22–32)
CREATININE: 0.53 mg/dL (ref 0.44–1.00)
Calcium: 9 mg/dL (ref 8.9–10.3)
Glucose, Bld: 106 mg/dL — ABNORMAL HIGH (ref 65–99)
POTASSIUM: 4.2 mmol/L (ref 3.5–5.1)
Sodium: 134 mmol/L — ABNORMAL LOW (ref 135–145)
Total Bilirubin: 0.6 mg/dL (ref 0.3–1.2)
Total Protein: 7.2 g/dL (ref 6.5–8.1)

## 2016-12-16 MED ORDER — ALPRAZOLAM 0.25 MG PO TABS
0.2500 mg | ORAL_TABLET | Freq: Every evening | ORAL | 0 refills | Status: DC | PRN
Start: 2016-12-16 — End: 2017-06-29

## 2016-12-16 NOTE — Progress Notes (Unsigned)
Called in the refill of xanax for pt to [address].

## 2016-12-16 NOTE — Progress Notes (Signed)
Pt stated doing well except w med changes feeling anxious and nausea-intermittently

## 2016-12-16 NOTE — Progress Notes (Signed)
Hematology/Oncology Consult note [facility]  Telephone:([phone] Fax:([phone]) [phone]  Patient Care Team: Cletis Athens, MD as PCP - General (Internal Medicine)   Name of the patient: Deborah Trevino  [id number]  1946-01-07   Date of visit: 12/16/16  Diagnosis- history of invasive ductal carcinoma of the left breast in 1988 status post modified radical mastectomy. Invasive lobular carcinoma stage II in 2012   Chief complaint/ Reason for visit- assess tolerance to tamoxifen  Heme/Onc history:  Oncology History   1. Invasive lobular carcinoma (June 2 012).  All margins are positive.  Approximate sizes more than 3 cm. size is  approximately 4.5. after re-resection(July, 2012) 2. Previous history of carcinoma of breast in July of 1988, invasive ductal carcinoma in the left breast, status post modified radical mastectomy 3. Prophylactic subcutaneous mastectomy on the right side.  Multifocal ductal carcinoma in situ present. 4. Patient had reconstructive surgery on both sides.(1988) 5. Oncotype DX score (August, 2012) been low risk.  Score of 14. 6. Radiation therapy (right breast) August 2012. 7. Started on Arimidex from September, 2012     Breast cancer [facility])   11/20/1986 Initial Diagnosis    DCIS- left breast      04/21/1987 Surgery    Bilateral mastectomy with reconstruction      05/21/2011 Cancer Diagnosis    Breast cancer - Right breast Invasive lobular carcinoma      05/21/2011 - 06/21/2011 Radiation Therapy         06/25/2011 -  Anti-estrogen oral therapy    with Arimidex      Final pathology of the second malignancy in the right breast was as follows: It was invasive lobular carcinoma at least 2 cm and may exceed 5 cm. No DCIS was present. Margins were positive and patient underwent wide reexcision. At that time 0 out of 5 lymph nodes were positive fo malignancy. The tumor was at least 4.2 x 1.9 x 2.2 cm in size. Reexcision margins were  clear  Last bone density scan from June 2016 revealed a T score of -3.3 at the AP spine consistent with osteoporosis  During my visit with the patient on 10/14/2016- extending hormone therapy were 10 years was discussed given that patient had stage II breast cancer. Decision was made to switch her to tamoxifen from Arimidex given pre-existing osteoporosis  Interval history- patient reports anxiety symptoms since he started taking tamoxifen. She has been using half a tablet of Ativan 0.25 mg about 2 times a week and that has been controlling her symptoms well. She also had some initial fatigue and arthralgias and myalgias after she started tamoxifen which have gradually resolved. She continues to be active and has been taking her calcium and vitamin D supplements.  ECOG PS- 0 Pain scale- 0   Review of systems- Review of Systems  Constitutional: Negative for chills, fever, malaise/fatigue and weight loss.  HENT: Negative for congestion, ear discharge and nosebleeds.   Eyes: Negative for blurred vision.  Respiratory: Negative for cough, hemoptysis, sputum production, shortness of breath and wheezing.   Cardiovascular: Negative for chest pain, palpitations, orthopnea and claudication.  Gastrointestinal: Negative for abdominal pain, blood in stool, constipation, diarrhea, heartburn, melena, nausea and vomiting.  Genitourinary: Negative for dysuria, flank pain, frequency, hematuria and urgency.  Musculoskeletal: Negative for back pain, joint pain and myalgias.  Skin: Negative for rash.  Neurological: Negative for dizziness, tingling, focal weakness, seizures, weakness and headaches.  Endo/Heme/Allergies: Does not bruise/bleed easily.  Psychiatric/Behavioral:  Negative for depression and suicidal ideas. The patient does not have insomnia.      Current treatment- tamoxifen  Allergies  Allergen Reactions  . Latex Rash     Past Medical History:  Diagnosis Date  . Breast cancer ([place]) 1988     LT MASTECTOMY  . Breast cancer ([place]) 1989   RT LUMPECTOMY  . Breast cancer ([place]) 2012   RT LUMPECTOMY  . Hypertension   . MVP (mitral valve prolapse)      Past Surgical History:  Procedure Laterality Date  . ABDOMINAL HYSTERECTOMY    . AUGMENTATION MAMMAPLASTY Bilateral 1989  . BREAST LUMPECTOMY Right 1989   IN SITU FOUND DURING SUBCUTANEOUS MASTECTOMY  . BREAST LUMPECTOMY Right 2012  . BREAST RECONSTRUCTION Bilateral   . MASTECTOMY Left 1988   BREAST CA  . MASTECTOMY SUBCUTANEOUS Right 1989   BREAST CA FOUND    Social History   Social History  . Marital status: Married    Spouse name: N/A  . Number of children: N/A  . Years of education: N/A   Occupational History  . Not on file.   Social History Main Topics  . Smoking status: Former Research scientist (life sciences)  . Smokeless tobacco: Never Used  . Alcohol use 1.2 oz/week    2 Glasses of wine per week  . Drug use: Unknown  . Sexual activity: Not on file   Other Topics Concern  . Not on file   Social History Narrative  . No narrative on file    Family History  Problem Relation Age of Onset  . Breast cancer Sister   . Breast cancer Maternal Aunt   . Breast cancer Paternal Grandmother      Current Outpatient Prescriptions:  .  ALPRAZolam (XANAX) 0.25 MG tablet, Take 1 tablet (0.25 mg total) by mouth at bedtime as needed for anxiety., Disp: 30 tablet, Rfl: 0 .  Ascorbic Acid (VITAMIN C) 1000 MG tablet, Take 1,000 mg by mouth daily., Disp: , Rfl:  .  aspirin 81 MG tablet, Take 81 mg by mouth daily., Disp: , Rfl:  .  calcium carbonate (OS-CAL) 600 MG TABS tablet, Take 600 mg by mouth 2 (two) times daily with a meal., Disp: , Rfl:  .  metoprolol tartrate (LOPRESSOR) 25 MG tablet, Take 25 mg by mouth 2 (two) times daily., Disp: , Rfl:  .  tamoxifen (NOLVADEX) 20 MG tablet, Take 1 tablet (20 mg total) by mouth daily., Disp: 90 tablet, Rfl: 3  Physical exam:  Vitals:   12/16/16 1031  BP: (!) 151/81  Pulse: (!) 54  Resp: 18   Temp: (!) 96.2 F (35.7 C)  TempSrc: Tympanic  Weight: 141 lb 13.9 oz (64.4 kg)   Physical Exam  Constitutional: She is oriented to person, place, and time and well-developed, well-nourished, and in no distress.  HENT:  Head: Normocephalic and atraumatic.  Eyes: EOM are normal. Pupils are equal, round, and reactive to light.  Neck: Normal range of motion.  Cardiovascular: Normal rate, regular rhythm and normal heart sounds.   Pulmonary/Chest: Effort normal and breath sounds normal.  Abdominal: Soft. Bowel sounds are normal.  Neurological: She is alert and oriented to person, place, and time.  Skin: Skin is warm and dry.    Breast exam not performed today   CMP Latest Ref Rng & Units 10/14/2016  Glucose 65 - 99 mg/dL 101(H)  BUN 6 - 20 mg/dL 18  Creatinine 0.44 - 1.00 mg/dL 0.52  Sodium 135 - 145 mmol/L  135  Potassium 3.5 - 5.1 mmol/L 4.1  Chloride 101 - 111 mmol/L 102  CO2 22 - 32 mmol/L 26  Calcium 8.9 - 10.3 mg/dL 9.1  Total Protein 6.5 - 8.1 g/dL 7.2  Total Bilirubin 0.3 - 1.2 mg/dL 0.6  Alkaline Phos 38 - 126 U/L 61  AST 15 - 41 U/L 21  ALT 14 - 54 U/L 18   CBC Latest Ref Rng & Units 10/14/2016  WBC 3.6 - 11.0 K/uL 5.9  Hemoglobin 12.0 - 16.0 g/dL 13.1  Hematocrit 35.0 - 47.0 % 38.5  Platelets 150 - 440 K/uL 182     Assessment and plan- Patient is a 71 y.o. female with a history of recurrent stage II right breast cancer in 2012 currently on tamoxifen  1. We will be ordering her right breast mammogram as well as a DEXA scan in June 2018. Patient is tolerating her tamoxifen well and will continue that for a total period of 5 years from now. Patient is status post hysterectomy.   2. Anxiety- we will prescribe her 0.25 mg of Ativan once a day as needed 30 tablets no refills.  3. History of osteoporosis- patient is skeptical about starting oral or parenteral bisphosphonates at this time given the risk factor of osteonecrosis of the jaw. Patient would like to think  about this after her next DEXA scan    I will see her back in 6 months time   Visit Diagnosis No diagnosis found.   Dr. Randa Evens, MD, MPH [facility] Pager- [phone] 12/16/2016 8:27 AM

## 2017-01-30 DIAGNOSIS — H43812 Vitreous degeneration, left eye: Secondary | ICD-10-CM | POA: Diagnosis not present

## 2017-02-02 ENCOUNTER — Ambulatory Visit
Admission: RE | Admit: 2017-02-02 | Discharge: 2017-02-02 | Disposition: A | Discharge status: Home / Self Care | Payer: Medicare Other | Source: Ambulatory Visit | Attending: Oncology | Admitting: Oncology

## 2017-02-02 DIAGNOSIS — Z170 Estrogen receptor positive status [ER+]: Secondary | ICD-10-CM

## 2017-02-02 DIAGNOSIS — Z853 Personal history of malignant neoplasm of breast: Secondary | ICD-10-CM | POA: Diagnosis not present

## 2017-02-02 DIAGNOSIS — M810 Age-related osteoporosis without current pathological fracture: Secondary | ICD-10-CM | POA: Insufficient documentation

## 2017-02-02 DIAGNOSIS — C50912 Malignant neoplasm of unspecified site of left female breast: Secondary | ICD-10-CM

## 2017-02-06 ENCOUNTER — Telehealth: Payer: Self-pay | Admitting: *Deleted

## 2017-02-06 NOTE — Progress Notes (Signed)
Can you inform patient her dexa results? I dont see her before august. She has been osteoporotic before but hesitant to start bisphosphonates. See if she has changed her mind

## 2017-02-06 NOTE — Telephone Encounter (Signed)
called patient with Bone Density results and asked whether she wanted to try medication. Asked her to call Kristian Covey  RN if she wants to discuss medication.

## 2017-03-16 DIAGNOSIS — I10 Essential (primary) hypertension: Secondary | ICD-10-CM | POA: Diagnosis not present

## 2017-03-16 DIAGNOSIS — C50919 Malignant neoplasm of unspecified site of unspecified female breast: Secondary | ICD-10-CM | POA: Diagnosis not present

## 2017-03-16 DIAGNOSIS — Z9882 Breast implant status: Secondary | ICD-10-CM | POA: Diagnosis not present

## 2017-03-16 DIAGNOSIS — Z1389 Encounter for screening for other disorder: Secondary | ICD-10-CM | POA: Diagnosis not present

## 2017-03-16 DIAGNOSIS — M810 Age-related osteoporosis without current pathological fracture: Secondary | ICD-10-CM | POA: Diagnosis not present

## 2017-04-24 ENCOUNTER — Ambulatory Visit: Payer: Medicare Other

## 2017-04-24 ENCOUNTER — Other Ambulatory Visit: Payer: Medicare Other

## 2017-05-08 DIAGNOSIS — H43812 Vitreous degeneration, left eye: Secondary | ICD-10-CM | POA: Diagnosis not present

## 2017-05-09 ENCOUNTER — Ambulatory Visit
Admission: RE | Admit: 2017-05-09 | Discharge: 2017-05-09 | Disposition: A | Discharge status: Home / Self Care | Payer: Medicare Other | Source: Ambulatory Visit | Attending: Oncology | Admitting: Oncology

## 2017-05-09 ENCOUNTER — Other Ambulatory Visit: Payer: Medicare Other

## 2017-05-09 DIAGNOSIS — C50912 Malignant neoplasm of unspecified site of left female breast: Secondary | ICD-10-CM

## 2017-05-09 DIAGNOSIS — M810 Age-related osteoporosis without current pathological fracture: Secondary | ICD-10-CM | POA: Diagnosis not present

## 2017-05-09 DIAGNOSIS — Z170 Estrogen receptor positive status [ER+]: Principal | ICD-10-CM

## 2017-05-09 DIAGNOSIS — Z853 Personal history of malignant neoplasm of breast: Secondary | ICD-10-CM | POA: Diagnosis not present

## 2017-05-09 DIAGNOSIS — R928 Other abnormal and inconclusive findings on diagnostic imaging of breast: Secondary | ICD-10-CM | POA: Diagnosis not present

## 2017-06-15 ENCOUNTER — Ambulatory Visit: Payer: Medicare Other | Admitting: Oncology

## 2017-06-16 DIAGNOSIS — I10 Essential (primary) hypertension: Secondary | ICD-10-CM | POA: Diagnosis not present

## 2017-06-16 DIAGNOSIS — M810 Age-related osteoporosis without current pathological fracture: Secondary | ICD-10-CM | POA: Diagnosis not present

## 2017-06-16 DIAGNOSIS — Z9882 Breast implant status: Secondary | ICD-10-CM | POA: Diagnosis not present

## 2017-06-16 DIAGNOSIS — C50919 Malignant neoplasm of unspecified site of unspecified female breast: Secondary | ICD-10-CM | POA: Diagnosis not present

## 2017-06-29 ENCOUNTER — Other Ambulatory Visit: Payer: Self-pay | Admitting: *Deleted

## 2017-06-29 ENCOUNTER — Telehealth: Payer: Self-pay | Admitting: Oncology

## 2017-06-29 ENCOUNTER — Other Ambulatory Visit: Payer: Self-pay | Admitting: Oncology

## 2017-06-29 ENCOUNTER — Inpatient Hospital Stay: Payer: Medicare Other | Attending: Oncology | Admitting: Oncology

## 2017-06-29 VITALS — BP 145/86 | HR 56 | Temp 97.4°F | Resp 18 | Wt 142.2 lb

## 2017-06-29 DIAGNOSIS — Z9013 Acquired absence of bilateral breasts and nipples: Secondary | ICD-10-CM | POA: Insufficient documentation

## 2017-06-29 DIAGNOSIS — Z87891 Personal history of nicotine dependence: Secondary | ICD-10-CM | POA: Insufficient documentation

## 2017-06-29 DIAGNOSIS — Z170 Estrogen receptor positive status [ER+]: Secondary | ICD-10-CM | POA: Insufficient documentation

## 2017-06-29 DIAGNOSIS — I341 Nonrheumatic mitral (valve) prolapse: Secondary | ICD-10-CM | POA: Diagnosis not present

## 2017-06-29 DIAGNOSIS — Z79810 Long term (current) use of selective estrogen receptor modulators (SERMs): Secondary | ICD-10-CM | POA: Insufficient documentation

## 2017-06-29 DIAGNOSIS — C50911 Malignant neoplasm of unspecified site of right female breast: Secondary | ICD-10-CM | POA: Diagnosis not present

## 2017-06-29 DIAGNOSIS — F419 Anxiety disorder, unspecified: Secondary | ICD-10-CM | POA: Insufficient documentation

## 2017-06-29 DIAGNOSIS — I10 Essential (primary) hypertension: Secondary | ICD-10-CM | POA: Insufficient documentation

## 2017-06-29 DIAGNOSIS — Z853 Personal history of malignant neoplasm of breast: Secondary | ICD-10-CM | POA: Insufficient documentation

## 2017-06-29 DIAGNOSIS — Z90710 Acquired absence of both cervix and uterus: Secondary | ICD-10-CM | POA: Insufficient documentation

## 2017-06-29 DIAGNOSIS — Z7982 Long term (current) use of aspirin: Secondary | ICD-10-CM | POA: Insufficient documentation

## 2017-06-29 DIAGNOSIS — C50912 Malignant neoplasm of unspecified site of left female breast: Secondary | ICD-10-CM

## 2017-06-29 DIAGNOSIS — Z79899 Other long term (current) drug therapy: Secondary | ICD-10-CM | POA: Insufficient documentation

## 2017-06-29 MED ORDER — ALPRAZOLAM 0.25 MG PO TABS: 0.2500 mg | ORAL_TABLET | Freq: Every evening | ORAL | 0 refills | Status: DC | PRN

## 2017-06-29 NOTE — Telephone Encounter (Signed)
Called in refill of xanax per Dr. Janese Banks.

## 2017-06-29 NOTE — Progress Notes (Signed)
Here for follow up. C/o R temporal area pain- questions s/effect of Temoxifen. In good spirits.

## 2017-06-29 NOTE — Progress Notes (Signed)
Hematology/Oncology Consult note [facility]  [phone] Fax:([phone]  Patient Care Team: Cletis Athens, MD as PCP - General (Internal Medicine)   Name of the patient: Deborah Trevino  [id number]  1946/05/07   Date of visit: 06/29/17  Diagnosis- history of invasive ductal carcinoma of the left breast in 1988 status post modified radical mastectomy. Invasive lobular carcinoma stage II in 2012   Chief complaint/ Reason for visit- re-evaluation and follow-up after switching to tamoxifen in January 2018.   Heme/Onc history:  Oncology History   1. Invasive lobular carcinoma (June 2 012).  All margins are positive.  Approximate sizes more than 3 cm. size is  approximately 4.5. after re-resection(July, 2012) 2. Previous history of carcinoma of breast in July of 1988, invasive ductal carcinoma in the left breast, status post modified radical mastectomy 3. Prophylactic subcutaneous mastectomy on the right side.  Multifocal ductal carcinoma in situ present. 4. Patient had reconstructive surgery on both sides.(1988) 5. Oncotype DX score (August, 2012) been low risk.  Score of 14. 6. Radiation therapy (right breast) August 2012. 7. Started on Arimidex from September, 2012     Breast cancer [facility])   11/20/1986 Initial Diagnosis    DCIS- left breast      04/21/1987 Surgery    Bilateral mastectomy with reconstruction      05/21/2011 Cancer Diagnosis    Breast cancer - Right breast Invasive lobular carcinoma      05/21/2011 - 06/21/2011 Radiation Therapy         06/25/2011 -  Anti-estrogen oral therapy    with Arimidex      Final pathology of the second malignancy in the right breast was as follows: It was invasive lobular carcinoma at least 2 cm and may exceed 5 cm. No DCIS was present. Margins were positive and patient underwent wide reexcision. At that time 0 out of 5 lymph nodes were positive fo malignancy. The tumor was at least 4.2 x  1.9 x 2.2 cm in size. Reexcision margins were clear  Bone density scan from June 2016 revealed a T score of -3.3 at the AP spine consistent with osteoporosis. Repeat bone density scan from 02/02/17 showed worsening of osteoporosis with T score of -3.8.   During my visit with the patient on 10/14/2016- extending hormone therapy were 10 years was discussed given that patient had stage II breast cancer. Decision was made to switch her to tamoxifen from Arimidex given pre-existing osteoporosis  Interval history- Patient reports continued headaches particularly of the right side of her temple and shoulders. She has seen her dentist and PCP who believe her symptoms are related to anxiety and stress. She is prescribed Xanax 0.25 mg prn. She reports that she typically takes 1/2 a tablet approximately once a week but does request a refill today. She also had some initial fatigue and arthralgias and myalgias after she started tamoxifen which have gradually resolved. She continues to be active and has been taking her calcium 1200 mg and vitamin D 800 IU supplements. She has previously refused bisphosphonates due to concerns of osteonecrosis of the jaw and concerns of side effects.   ECOG PS- 0 Pain scale- 0   Review of systems- Review of Systems  Constitutional: Negative for chills, fever, malaise/fatigue and weight loss.  HENT: Negative for congestion, ear discharge and nosebleeds.   Eyes: Negative for blurred vision.  Respiratory: Negative for cough, hemoptysis, sputum production, shortness of breath and wheezing.   Cardiovascular: Negative  for chest pain, palpitations, orthopnea and claudication.  Gastrointestinal: Negative for abdominal pain, blood in stool, constipation, diarrhea, heartburn, melena, nausea and vomiting.  Genitourinary: Negative for dysuria, flank pain, frequency, hematuria and urgency.  Musculoskeletal: Negative for back pain, joint pain and myalgias.  Skin: Negative for rash.    Neurological: Negative for dizziness, tingling, focal weakness, seizures, weakness and headaches.  Endo/Heme/Allergies: Does not bruise/bleed easily.  Psychiatric/Behavioral: Negative for depression and suicidal ideas. The patient is nervous/anxious. The patient does not have insomnia.      Current treatment- tamoxifen  Allergies  Allergen Reactions  . Adhesive [Tape] Rash  . Latex Rash    Past Medical History:  Diagnosis Date  . Breast cancer ([place]) 1988   LT MASTECTOMY  . Breast cancer ([place]) 1989   RT LUMPECTOMY  . Breast cancer ([place]) 2012   RT LUMPECTOMY  . Hypertension   . MVP (mitral valve prolapse)     Past Surgical History:  Procedure Laterality Date  . ABDOMINAL HYSTERECTOMY    . AUGMENTATION MAMMAPLASTY Bilateral 1989  . BREAST LUMPECTOMY Right 1989   IN SITU FOUND DURING SUBCUTANEOUS MASTECTOMY  . BREAST LUMPECTOMY Right 2012  . BREAST RECONSTRUCTION Bilateral   . MASTECTOMY Left 1988   BREAST CA  . MASTECTOMY SUBCUTANEOUS Right 1989   BREAST CA FOUND   Social History   Social History  . Marital status: Married    Spouse name: N/A  . Number of children: N/A  . Years of education: N/A   Occupational History  . Not on file.   Social History Main Topics  . Smoking status: Former Research scientist (life sciences)  . Smokeless tobacco: Never Used  . Alcohol use 1.2 oz/week    2 Glasses of wine per week  . Drug use: Unknown  . Sexual activity: Not on file   Other Topics Concern  . Not on file   Social History Narrative  . No narrative on file    Family History  Problem Relation Age of Onset  . Breast cancer Sister   . Breast cancer Maternal Aunt   . Breast cancer Paternal Grandmother     Current Outpatient Prescriptions:  .  ALPRAZolam (XANAX) 0.25 MG tablet, Take 1 tablet (0.25 mg total) by mouth at bedtime as needed for anxiety., Disp: 30 tablet, Rfl: 0 .  Ascorbic Acid (VITAMIN C) 1000 MG tablet, Take 1,000 mg by mouth daily., Disp: , Rfl:  .  aspirin 81 MG  tablet, Take 81 mg by mouth daily., Disp: , Rfl:  .  calcium carbonate (OS-CAL) 600 MG TABS tablet, Take 600 mg by mouth 2 (two) times daily with a meal., Disp: , Rfl:  .  metoprolol tartrate (LOPRESSOR) 25 MG tablet, Take 25 mg by mouth 2 (two) times daily., Disp: , Rfl:  .  tamoxifen (NOLVADEX) 20 MG tablet, Take 1 tablet (20 mg total) by mouth daily., Disp: 90 tablet, Rfl: 3  Physical exam:  Vitals:   06/29/17 1006  BP: (!) 145/86  Pulse: (!) 56  Resp: 18  Temp: (!) 97.4 F (36.3 C)  TempSrc: Tympanic  Weight: 142 lb 3.2 oz (64.5 kg)   Physical Exam  Constitutional: She is oriented to person, place, and time and well-developed, well-nourished, and in no distress.  HENT:  Head: Normocephalic and atraumatic.  Eyes: Pupils are equal, round, and reactive to light. EOM are normal.  Neck: Normal range of motion.  Cardiovascular: Normal rate, regular rhythm and normal heart sounds.   Pulmonary/Chest: Effort normal  and breath sounds normal.  Abdominal: Soft. Bowel sounds are normal.  Neurological: She is alert and oriented to person, place, and time.  Skin: Skin is warm and dry.  Breast exam performed. Breast exam was performed in seated and lying down position. Patient is status post right lumpectomy with reconstruction and a well-healed surgical scar. No evidence of any palpable masses. No evidence of axillary adenopathy. Left breast is post mastectomy with reconstruction and well-healed surgical scar.     CMP Latest Ref Rng & Units 12/16/2016  Glucose 65 - 99 mg/dL 106(H)  BUN 6 - 20 mg/dL 17  Creatinine 0.44 - 1.00 mg/dL 0.53  Sodium 135 - 145 mmol/L 134(L)  Potassium 3.5 - 5.1 mmol/L 4.2  Chloride 101 - 111 mmol/L 102  CO2 22 - 32 mmol/L 25  Calcium 8.9 - 10.3 mg/dL 9.0  Total Protein 6.5 - 8.1 g/dL 7.2  Total Bilirubin 0.3 - 1.2 mg/dL 0.6  Alkaline Phos 38 - 126 U/L 48  AST 15 - 41 U/L 18  ALT 14 - 54 U/L 12(L)   CBC Latest Ref Rng & Units 10/14/2016  WBC 3.6 - 11.0  K/uL 5.9  Hemoglobin 12.0 - 16.0 g/dL 13.1  Hematocrit 35.0 - 47.0 % 38.5  Platelets 150 - 440 K/uL 182     Assessment and plan- Patient is a 71 y.o. female with a history of recurrent stage II right breast cancer in 2012 currently on tamoxifen  1. Will plan to repeat mammogram July 2019 and repeat DEXA scan April 2020. She appears to be tolerating the tamoxifen well and will continue through January 2023. Patient is status post hysterectomy.    2. Anxiety- we will prescribe her 0.25 mg of Xanax once a day as needed 30 tablets no refills. Encouraged her to discuss her anxiety with her primary care doctor.   3. History of osteoporosis- patient is skeptical about starting oral or parenteral bisphosphonates at this time given the risk factor of osteonecrosis of the jaw. Discussed worsening DEXA scan results. She continues to take Calcium and Vitamin D supplements appropriately and participates in weight bearing exercise as tolerated.   Will plan to see her back in 6 months with CMP.    Visit Diagnosis 1. Malignant neoplasm of left breast in female, estrogen receptor positive, unspecified site of breast ([place])     Natascha Edmonds G. Zenia Resides, NP 06/29/17 11:47 AM   Dr. Randa Evens, MD, MPH [place] [facility] Pager- [phone] 06/29/2017 11:46 AM

## 2017-06-29 NOTE — Telephone Encounter (Signed)
Lab/MD/US/Mammo appointments mailed to patient, per patient request.

## 2017-08-29 DIAGNOSIS — H43812 Vitreous degeneration, left eye: Secondary | ICD-10-CM | POA: Diagnosis not present

## 2017-09-01 DIAGNOSIS — E7849 Other hyperlipidemia: Secondary | ICD-10-CM | POA: Diagnosis not present

## 2017-09-01 DIAGNOSIS — I10 Essential (primary) hypertension: Secondary | ICD-10-CM | POA: Diagnosis not present

## 2017-09-01 DIAGNOSIS — R5381 Other malaise: Secondary | ICD-10-CM | POA: Diagnosis not present

## 2017-09-08 DIAGNOSIS — Z887 Allergy status to serum and vaccine status: Secondary | ICD-10-CM | POA: Diagnosis not present

## 2017-09-08 DIAGNOSIS — C50919 Malignant neoplasm of unspecified site of unspecified female breast: Secondary | ICD-10-CM | POA: Diagnosis not present

## 2017-09-08 DIAGNOSIS — Z9882 Breast implant status: Secondary | ICD-10-CM | POA: Diagnosis not present

## 2017-10-10 ENCOUNTER — Other Ambulatory Visit: Payer: Self-pay | Admitting: Oncology

## 2017-10-10 DIAGNOSIS — Z170 Estrogen receptor positive status [ER+]: Principal | ICD-10-CM

## 2017-10-10 DIAGNOSIS — C50912 Malignant neoplasm of unspecified site of left female breast: Secondary | ICD-10-CM

## 2017-11-20 DIAGNOSIS — H25813 Combined forms of age-related cataract, bilateral: Secondary | ICD-10-CM | POA: Diagnosis not present

## 2017-12-14 DIAGNOSIS — C50919 Malignant neoplasm of unspecified site of unspecified female breast: Secondary | ICD-10-CM | POA: Diagnosis not present

## 2017-12-14 DIAGNOSIS — Z9882 Breast implant status: Secondary | ICD-10-CM | POA: Diagnosis not present

## 2017-12-14 DIAGNOSIS — M810 Age-related osteoporosis without current pathological fracture: Secondary | ICD-10-CM | POA: Diagnosis not present

## 2017-12-18 DIAGNOSIS — C50919 Malignant neoplasm of unspecified site of unspecified female breast: Secondary | ICD-10-CM | POA: Diagnosis not present

## 2017-12-18 DIAGNOSIS — I341 Nonrheumatic mitral (valve) prolapse: Secondary | ICD-10-CM | POA: Diagnosis not present

## 2017-12-18 DIAGNOSIS — R002 Palpitations: Secondary | ICD-10-CM | POA: Diagnosis not present

## 2017-12-18 DIAGNOSIS — I10 Essential (primary) hypertension: Secondary | ICD-10-CM | POA: Diagnosis not present

## 2017-12-28 ENCOUNTER — Inpatient Hospital Stay: Payer: Medicare Other

## 2017-12-28 ENCOUNTER — Inpatient Hospital Stay: Payer: Medicare Other | Attending: Oncology | Admitting: Oncology

## 2017-12-28 ENCOUNTER — Encounter: Payer: Self-pay | Admitting: Oncology

## 2017-12-28 VITALS — BP 147/94 | HR 79 | Temp 98.4°F | Wt 145.0 lb

## 2017-12-28 DIAGNOSIS — Z170 Estrogen receptor positive status [ER+]: Secondary | ICD-10-CM

## 2017-12-28 DIAGNOSIS — Z79899 Other long term (current) drug therapy: Secondary | ICD-10-CM | POA: Diagnosis not present

## 2017-12-28 DIAGNOSIS — Z853 Personal history of malignant neoplasm of breast: Secondary | ICD-10-CM | POA: Diagnosis not present

## 2017-12-28 DIAGNOSIS — Z79810 Long term (current) use of selective estrogen receptor modulators (SERMs): Secondary | ICD-10-CM | POA: Insufficient documentation

## 2017-12-28 DIAGNOSIS — I10 Essential (primary) hypertension: Secondary | ICD-10-CM | POA: Diagnosis not present

## 2017-12-28 DIAGNOSIS — I341 Nonrheumatic mitral (valve) prolapse: Secondary | ICD-10-CM | POA: Insufficient documentation

## 2017-12-28 DIAGNOSIS — Z87891 Personal history of nicotine dependence: Secondary | ICD-10-CM | POA: Diagnosis not present

## 2017-12-28 DIAGNOSIS — C50912 Malignant neoplasm of unspecified site of left female breast: Secondary | ICD-10-CM

## 2017-12-28 DIAGNOSIS — C50911 Malignant neoplasm of unspecified site of right female breast: Secondary | ICD-10-CM | POA: Diagnosis not present

## 2017-12-28 DIAGNOSIS — Z923 Personal history of irradiation: Secondary | ICD-10-CM | POA: Insufficient documentation

## 2017-12-28 DIAGNOSIS — Z9013 Acquired absence of bilateral breasts and nipples: Secondary | ICD-10-CM | POA: Insufficient documentation

## 2017-12-28 LAB — COMPREHENSIVE METABOLIC PANEL
ALK PHOS: 38 U/L (ref 38–126)
ALT: 16 U/L (ref 14–54)
AST: 25 U/L (ref 15–41)
Albumin: 4.2 g/dL (ref 3.5–5.0)
Anion gap: 9 (ref 5–15)
BUN: 17 mg/dL (ref 6–20)
CALCIUM: 9 mg/dL (ref 8.9–10.3)
CHLORIDE: 103 mmol/L (ref 101–111)
CO2: 25 mmol/L (ref 22–32)
CREATININE: 0.7 mg/dL (ref 0.44–1.00)
Glucose, Bld: 133 mg/dL — ABNORMAL HIGH (ref 65–99)
Potassium: 4.1 mmol/L (ref 3.5–5.1)
Sodium: 137 mmol/L (ref 135–145)
Total Bilirubin: 0.3 mg/dL (ref 0.3–1.2)
Total Protein: 6.9 g/dL (ref 6.5–8.1)

## 2017-12-29 NOTE — Progress Notes (Signed)
Hematology/Oncology Consult note [facility]  Telephone:([phone] Fax:([phone]) [phone]  Patient Care Team: Cletis Athens, MD as PCP - General (Internal Medicine)   Name of the patient: Deborah Trevino  [id number]  May 17, 1946   Date of visit: 12/29/17  Diagnosis- history of invasive ductal carcinoma of the left breast in 1988 status post modified radical mastectomy. Invasive lobular carcinoma stage II in 2012  Chief complaint/ Reason for visit- routine f/u of breast cancer  Heme/Onc history:  Oncology History   1. Invasive lobular carcinoma (June 2 012).  All margins are positive.  Approximate sizes more than 3 cm. size is  approximately 4.5. after re-resection(July, 2012) 2. Previous history of carcinoma of breast in July of 1988, invasive ductal carcinoma in the left breast, status post modified radical mastectomy 3. Prophylactic subcutaneous mastectomy on the right side.  Multifocal ductal carcinoma in situ present. 4. Patient had reconstructive surgery on both sides.(1988) 5. Oncotype DX score (August, 2012) been low risk.  Score of 14. 6. Radiation therapy (right breast) August 2012. 7. Started on Arimidex from September, 2012     Breast cancer [facility])   11/20/1986 Initial Diagnosis    DCIS- left breast      04/21/1987 Surgery    Bilateral mastectomy with reconstruction      05/21/2011 Cancer Diagnosis    Breast cancer - Right breast Invasive lobular carcinoma      05/21/2011 - 06/21/2011 Radiation Therapy         06/25/2011 -  Anti-estrogen oral therapy    with Arimidex      Final pathology of the second malignancy in the right breast was as follows: It was invasive lobular carcinoma at least 2 cm and may exceed 5 cm. No DCIS was present. Margins were positive and patient underwent wide reexcision. At that time 0 out of 5 lymph nodes were positive for malignancy. The tumor was at least 4.2 x 1.9 x 2.2 cm in size. Reexcision margins were  clear  Last bone density scan from June 2016 revealed a T score of -3.3 at the AP spine consistent with osteoporosis  During my visit with the patient on 10/14/2016- extending hormone therapy were 10 years was discussed given that patient had stage II breast cancer. Decision was made to switch her to tamoxifen from Arimidex given pre-existing osteoporosis   Interval history- she is tolerating her tamoxifen well and denies any fatigue or hot flashes. She has been having b/l jaw pain for last couple of months and was seen by her dentist and was diagnosed with TM joint arthritis. She is wondering if her jaw pain/arthritis is sceondry to tamoxifen. She has been using tylenol/ msucle relaxants with limited benefit  ECOG PS- 0 Pain scale- 2   Review of systems- Review of Systems  Constitutional: Negative for chills, fever, malaise/fatigue and weight loss.  HENT: Negative for congestion, ear discharge and nosebleeds.        B/l jaw pain  Eyes: Negative for blurred vision.  Respiratory: Negative for cough, hemoptysis, sputum production, shortness of breath and wheezing.   Cardiovascular: Negative for chest pain, palpitations, orthopnea and claudication.  Gastrointestinal: Negative for abdominal pain, blood in stool, constipation, diarrhea, heartburn, melena, nausea and vomiting.  Genitourinary: Negative for dysuria, flank pain, frequency, hematuria and urgency.  Musculoskeletal: Negative for back pain, joint pain and myalgias.  Skin: Negative for rash.  Neurological: Negative for dizziness, tingling, focal weakness, seizures, weakness and headaches.  Endo/Heme/Allergies: Does not bruise/bleed  easily.  Psychiatric/Behavioral: Negative for depression and suicidal ideas. The patient does not have insomnia.       Allergies  Allergen Reactions  . Adhesive [Tape] Rash  . Latex Rash     Past Medical History:  Diagnosis Date  . Breast cancer ([place]) 1988   LT MASTECTOMY  . Breast cancer  ([place]) 1989   RT LUMPECTOMY  . Breast cancer ([place]) 2012   RT LUMPECTOMY  . Hypertension   . MVP (mitral valve prolapse)      Past Surgical History:  Procedure Laterality Date  . ABDOMINAL HYSTERECTOMY    . AUGMENTATION MAMMAPLASTY Bilateral 1989  . BREAST LUMPECTOMY Right 1989   IN SITU FOUND DURING SUBCUTANEOUS MASTECTOMY  . BREAST LUMPECTOMY Right 2012  . BREAST RECONSTRUCTION Bilateral   . MASTECTOMY Left 1988   BREAST CA  . MASTECTOMY SUBCUTANEOUS Right 1989   BREAST CA FOUND    Social History   Socioeconomic History  . Marital status: Married    Spouse name: Not on file  . Number of children: Not on file  . Years of education: Not on file  . Highest education level: Not on file  Social Needs  . Financial resource strain: Not on file  . Food insecurity - worry: Not on file  . Food insecurity - inability: Not on file  . Transportation needs - medical: Not on file  . Transportation needs - non-medical: Not on file  Occupational History  . Not on file  Tobacco Use  . Smoking status: Former Research scientist (life sciences)  . Smokeless tobacco: Never Used  Substance and Sexual Activity  . Alcohol use: Yes    Alcohol/week: 1.2 oz    Types: 2 Glasses of wine per week  . Drug use: Not on file  . Sexual activity: Not on file  Other Topics Concern  . Not on file  Social History Narrative  . Not on file    Family History  Problem Relation Age of Onset  . Breast cancer Sister   . Breast cancer Maternal Aunt   . Breast cancer Paternal Grandmother      Current Outpatient Medications:  .  ALPRAZolam (XANAX) 0.25 MG tablet, TAKE 1 TABLET BY MOUTH AT BEDTIME AS NEEDED FOR ANXIETY, Disp: 30 tablet, Rfl: 0 .  Ascorbic Acid (VITAMIN C) 1000 MG tablet, Take 1,000 mg by mouth daily., Disp: , Rfl:  .  aspirin 81 MG tablet, Take 81 mg by mouth daily., Disp: , Rfl:  .  calcium carbonate (OS-CAL) 600 MG TABS tablet, Take 600 mg by mouth 2 (two) times daily with a meal., Disp: , Rfl:  .   cyclobenzaprine (FLEXERIL) 10 MG tablet, Take 10 mg by mouth 3 (three) times daily as needed for muscle spasms., Disp: , Rfl:  .  metoprolol tartrate (LOPRESSOR) 25 MG tablet, Take 25 mg by mouth 2 (two) times daily., Disp: , Rfl:  .  tamoxifen (NOLVADEX) 20 MG tablet, TAKE ONE TABLET BY MOUTH ONCE DAILY, Disp: 90 tablet, Rfl: 3  Physical exam:  Vitals:   12/28/17 1355  BP: (!) 147/94  Pulse: 79  Temp: 98.4 F (36.9 C)  TempSrc: Tympanic  Weight: 145 lb (65.8 kg)   Physical Exam  Constitutional: She is oriented to person, place, and time and well-developed, well-nourished, and in no distress.  HENT:  Head: Normocephalic and atraumatic.  Eyes: EOM are normal. Pupils are equal, round, and reactive to light.  Neck: Normal range of motion.  Cardiovascular: Normal rate, regular  rhythm and normal heart sounds.  Pulmonary/Chest: Effort normal and breath sounds normal.  Abdominal: Soft. Bowel sounds are normal.  Neurological: She is alert and oriented to person, place, and time.  Skin: Skin is warm and dry.   Breast exam is performed in seated and lying down position. Patient is status post bilateral mastectomy with reconstruction. The implant edges are intact and there is no evidence of any chest wall recurrence. No evidence of bilateral axillary adenopathy    CMP Latest Ref Rng & Units 12/28/2017  Glucose 65 - 99 mg/dL 133(H)  BUN 6 - 20 mg/dL 17  Creatinine 0.44 - 1.00 mg/dL 0.70  Sodium 135 - 145 mmol/L 137  Potassium 3.5 - 5.1 mmol/L 4.1  Chloride 101 - 111 mmol/L 103  CO2 22 - 32 mmol/L 25  Calcium 8.9 - 10.3 mg/dL 9.0  Total Protein 6.5 - 8.1 g/dL 6.9  Total Bilirubin 0.3 - 1.2 mg/dL 0.3  Alkaline Phos 38 - 126 U/L 38  AST 15 - 41 U/L 25  ALT 14 - 54 U/L 16   CBC Latest Ref Rng & Units 10/14/2016  WBC 3.6 - 11.0 K/uL 5.9  Hemoglobin 12.0 - 16.0 g/dL 13.1  Hematocrit 35.0 - 47.0 % 38.5  Platelets 150 - 440 K/uL 182    Assessment and plan- Patient is a 72 y.o. female with  following issues:  1. Breast cancer- she was to continue tamoxifen until 2022. Recent mammogram in July 2018 was negative for malignancy. Repeat mammogram in July 2019. I do not think that her symptoms of jaw pain are due to tamoxifen. I have asked her to hold tamoxifen for a month and see if her symptoms improve. If they do not, she can restart the medication.   2. Osteoporosis- continue to monitor. Patient does not wish to take bisphosphates especially given her present jaw pain  I will see her back in 6 months. Mammogram prior   Visit Diagnosis 1. Malignant neoplasm of left breast in female, estrogen receptor positive, unspecified site of breast ([place])      Dr. Randa Evens, MD, MPH [place] [facility] Pager- [phone] 12/29/2017 1:08 PM

## 2018-03-30 DIAGNOSIS — R002 Palpitations: Secondary | ICD-10-CM | POA: Diagnosis not present

## 2018-03-30 DIAGNOSIS — Z9882 Breast implant status: Secondary | ICD-10-CM | POA: Diagnosis not present

## 2018-03-30 DIAGNOSIS — I10 Essential (primary) hypertension: Secondary | ICD-10-CM | POA: Diagnosis not present

## 2018-03-30 DIAGNOSIS — I341 Nonrheumatic mitral (valve) prolapse: Secondary | ICD-10-CM | POA: Diagnosis not present

## 2018-05-14 ENCOUNTER — Ambulatory Visit
Admission: RE | Admit: 2018-05-14 | Discharge: 2018-05-14 | Disposition: A | Discharge status: Home / Self Care | Payer: Medicare Other | Source: Ambulatory Visit | Attending: Oncology | Admitting: Oncology

## 2018-05-14 DIAGNOSIS — Z170 Estrogen receptor positive status [ER+]: Principal | ICD-10-CM

## 2018-05-14 DIAGNOSIS — R928 Other abnormal and inconclusive findings on diagnostic imaging of breast: Secondary | ICD-10-CM | POA: Diagnosis not present

## 2018-05-14 DIAGNOSIS — C50912 Malignant neoplasm of unspecified site of left female breast: Secondary | ICD-10-CM

## 2018-05-23 ENCOUNTER — Other Ambulatory Visit: Payer: Self-pay

## 2018-06-01 DIAGNOSIS — Z0000 Encounter for general adult medical examination without abnormal findings: Secondary | ICD-10-CM | POA: Diagnosis not present

## 2018-06-01 DIAGNOSIS — R002 Palpitations: Secondary | ICD-10-CM | POA: Diagnosis not present

## 2018-06-01 DIAGNOSIS — Z853 Personal history of malignant neoplasm of breast: Secondary | ICD-10-CM | POA: Diagnosis not present

## 2018-06-01 DIAGNOSIS — I341 Nonrheumatic mitral (valve) prolapse: Secondary | ICD-10-CM | POA: Diagnosis not present

## 2018-06-29 ENCOUNTER — Inpatient Hospital Stay: Payer: Medicare Other | Attending: Oncology | Admitting: Oncology

## 2018-06-29 ENCOUNTER — Encounter: Payer: Self-pay | Admitting: Oncology

## 2018-06-29 VITALS — BP 130/84 | HR 81 | Temp 98.8°F | Resp 18 | Ht 67.0 in | Wt 140.3 lb

## 2018-06-29 DIAGNOSIS — I10 Essential (primary) hypertension: Secondary | ICD-10-CM | POA: Insufficient documentation

## 2018-06-29 DIAGNOSIS — Z170 Estrogen receptor positive status [ER+]: Secondary | ICD-10-CM | POA: Diagnosis not present

## 2018-06-29 DIAGNOSIS — Z923 Personal history of irradiation: Secondary | ICD-10-CM

## 2018-06-29 DIAGNOSIS — Z7982 Long term (current) use of aspirin: Secondary | ICD-10-CM | POA: Insufficient documentation

## 2018-06-29 DIAGNOSIS — Z87891 Personal history of nicotine dependence: Secondary | ICD-10-CM | POA: Insufficient documentation

## 2018-06-29 DIAGNOSIS — Z79899 Other long term (current) drug therapy: Secondary | ICD-10-CM | POA: Insufficient documentation

## 2018-06-29 DIAGNOSIS — I341 Nonrheumatic mitral (valve) prolapse: Secondary | ICD-10-CM | POA: Diagnosis not present

## 2018-06-29 DIAGNOSIS — Z9223 Personal history of estrogen therapy: Secondary | ICD-10-CM | POA: Diagnosis not present

## 2018-06-29 DIAGNOSIS — C50912 Malignant neoplasm of unspecified site of left female breast: Secondary | ICD-10-CM

## 2018-06-29 DIAGNOSIS — Z853 Personal history of malignant neoplasm of breast: Secondary | ICD-10-CM | POA: Diagnosis not present

## 2018-06-29 DIAGNOSIS — M810 Age-related osteoporosis without current pathological fracture: Secondary | ICD-10-CM

## 2018-06-29 DIAGNOSIS — Z9013 Acquired absence of bilateral breasts and nipples: Secondary | ICD-10-CM | POA: Diagnosis not present

## 2018-07-03 NOTE — Progress Notes (Signed)
Hematology/Oncology Consult note [facility]  Telephone:([phone] Fax:([phone]  Patient Care Team: Cletis Athens, MD as PCP - General (Internal Medicine)   Name of the patient: Deborah Trevino  [id number]  [birth year]   Date of visit: 07/03/18  Diagnosis- history of invasive ductal carcinoma of the left breast in 1988 status post modified radical mastectomy. Invasive lobular carcinoma stage II in 2012  Chief complaint/ Reason for visit-routine follow-up of breast cancer  Heme/Onc history:  Oncology History   1. Invasive lobular carcinoma (June 2 012).  All margins are positive.  Approximate sizes more than 3 cm. size is  approximately 4.5. after re-resection(July, 2012) 2. Previous history of carcinoma of breast in July of 1988, invasive ductal carcinoma in the left breast, status post modified radical mastectomy 3. Prophylactic subcutaneous mastectomy on the right side.  Multifocal ductal carcinoma in situ present. 4. Patient had reconstructive surgery on both sides.(1988) 5. Oncotype DX score (August, 2012) been low risk.  Score of 14. 6. Radiation therapy (right breast) August 2012. 7. Started on Arimidex from September, 2012     Breast cancer [facility])   11/20/1986 Initial Diagnosis    DCIS- left breast    04/21/1987 Surgery    Bilateral mastectomy with reconstruction    05/21/2011 Cancer Diagnosis    Breast cancer - Right breast Invasive lobular carcinoma    05/21/2011 - 06/21/2011 Radiation Therapy       06/25/2011 -  Anti-estrogen oral therapy    with Arimidex    Final pathology of the second malignancy in the right breast was as follows: It was invasive lobular carcinoma at least 2 cm and may exceed 5 cm. No DCIS was present. Margins were positive and patient underwent wide reexcision. At that time 0 out of 5 lymph nodes were positive for malignancy. The tumor was at least 4.2 x 1.9 x 2.2 cm in size. Reexcision margins were clear  Last  bone density scan from June 2016 revealed a T score of -3.3 at the AP spine consistent with osteoporosis  During my visit with the patient on 10/14/2016-extending hormone therapy were 10 years was discussed given that patient had stage II breast cancer. Decision was made to switch her to tamoxifen from Arimidex given pre-existing osteoporosis   Interval history-patient has not taken tamoxifen over the last 6 months as she was experiencing some jaw pain secondary to it.  She states that when she stopped the tamoxifen not only the jaw pain got better but also her overall fatigue and joint pains have improved.  She does not wish to go back on any hormone therapy at this time  ECOG PS- 1 Pain scale- 0 Opioid associated constipation- no  Review of systems- Review of Systems  Constitutional: Negative for chills, fever, malaise/fatigue and weight loss.  HENT: Negative for congestion, ear discharge and nosebleeds.   Eyes: Negative for blurred vision.  Respiratory: Negative for cough, hemoptysis, sputum production, shortness of breath and wheezing.   Cardiovascular: Negative for chest pain, palpitations, orthopnea and claudication.  Gastrointestinal: Negative for abdominal pain, blood in stool, constipation, diarrhea, heartburn, melena, nausea and vomiting.  Genitourinary: Negative for dysuria, flank pain, frequency, hematuria and urgency.  Musculoskeletal: Negative for back pain, joint pain and myalgias.  Skin: Negative for rash.  Neurological: Negative for dizziness, tingling, focal weakness, seizures, weakness and headaches.  Endo/Heme/Allergies: Does not bruise/bleed easily.  Psychiatric/Behavioral: Negative for depression and suicidal ideas. The patient does not have insomnia.  Allergies  Allergen Reactions  . Adhesive [Tape] Rash  . Latex Rash     Past Medical History:  Diagnosis Date  . Breast cancer ([place]) 1988   LT MASTECTOMY  . Breast cancer ([place]) 1989   RT LUMPECTOMY  .  Breast cancer ([place]) 2012   RT LUMPECTOMY  . Hypertension   . MVP (mitral valve prolapse)      Past Surgical History:  Procedure Laterality Date  . ABDOMINAL HYSTERECTOMY    . AUGMENTATION MAMMAPLASTY Bilateral 1989  . BREAST LUMPECTOMY Right 1989   IN SITU FOUND DURING SUBCUTANEOUS MASTECTOMY  . BREAST LUMPECTOMY Right 2012  . BREAST RECONSTRUCTION Bilateral   . MASTECTOMY Left 1988   BREAST CA  . MASTECTOMY SUBCUTANEOUS Right 1989   BREAST CA FOUND    Social History   Socioeconomic History  . Marital status: Married    Spouse name: Not on file  . Number of children: Not on file  . Years of education: Not on file  . Highest education level: Not on file  Occupational History  . Not on file  Social Needs  . Financial resource strain: Not on file  . Food insecurity:    Worry: Not on file    Inability: Not on file  . Transportation needs:    Medical: Not on file    Non-medical: Not on file  Tobacco Use  . Smoking status: Former Research scientist (life sciences)  . Smokeless tobacco: Never Used  Substance and Sexual Activity  . Alcohol use: Yes    Alcohol/week: 2.0 standard drinks    Types: 2 Glasses of wine per week  . Drug use: Not on file  . Sexual activity: Not on file  Lifestyle  . Physical activity:    Days per week: Not on file    Minutes per session: Not on file  . Stress: Not on file  Relationships  . Social connections:    Talks on phone: Not on file    Gets together: Not on file    Attends religious service: Not on file    Active member of club or organization: Not on file    Attends meetings of clubs or organizations: Not on file    Relationship status: Not on file  . Intimate partner violence:    Fear of current or ex partner: Not on file    Emotionally abused: Not on file    Physically abused: Not on file    Forced sexual activity: Not on file  Other Topics Concern  . Not on file  Social History Narrative  . Not on file    Family History  Problem Relation Age of  Onset  . Breast cancer Sister   . Breast cancer Maternal Aunt   . Breast cancer Paternal Grandmother      Current Outpatient Medications:  .  ALPRAZolam (XANAX) 0.25 MG tablet, TAKE 1 TABLET BY MOUTH AT BEDTIME AS NEEDED FOR ANXIETY, Disp: 30 tablet, Rfl: 0 .  Ascorbic Acid (VITAMIN C) 1000 MG tablet, Take 1,000 mg by mouth daily., Disp: , Rfl:  .  Calcium Carb-Cholecalciferol (CALCIUM-VITAMIN D) 600-400 MG-UNIT TABS, Take 1 tablet by mouth 2 (two) times daily., Disp: , Rfl:  .  metoprolol tartrate (LOPRESSOR) 25 MG tablet, Take 25 mg by mouth 2 (two) times daily., Disp: , Rfl:  .  aspirin 81 MG tablet, Take 81 mg by mouth daily., Disp: , Rfl:  .  calcium carbonate (OS-CAL) 600 MG TABS tablet, Take 600 mg by mouth  2 (two) times daily with a meal., Disp: , Rfl:  .  cyclobenzaprine (FLEXERIL) 10 MG tablet, Take 10 mg by mouth 3 (three) times daily as needed for muscle spasms., Disp: , Rfl:  .  tamoxifen (NOLVADEX) 20 MG tablet, TAKE ONE TABLET BY MOUTH ONCE DAILY (Patient not taking: Reported on 06/29/2018), Disp: 90 tablet, Rfl: 3  Physical exam:  Vitals:   06/29/18 1431 06/29/18 1437  BP:  130/84  Pulse: 75 81  Resp: 18 18  Temp: 98.8 F (37.1 C)   TempSrc: Tympanic   Weight: 140 lb 4.8 oz (63.6 kg)   Height: 5\' 7"  (1.702 m)    Physical Exam  Constitutional: She is oriented to person, place, and time. She appears well-developed and well-nourished.  HENT:  Head: Normocephalic and atraumatic.  Eyes: Pupils are equal, round, and reactive to light. EOM are normal.  Neck: Normal range of motion.  Cardiovascular: Normal rate, regular rhythm and normal heart sounds.  Pulmonary/Chest: Effort normal and breath sounds normal.  Abdominal: Soft. Bowel sounds are normal.  Neurological: She is alert and oriented to person, place, and time.  Skin: Skin is warm and dry.  She is status post bilateral mastectomy with reconstruction.  No evidence of chest wall recurrence  CMP Latest Ref Rng &  Units 12/28/2017  Glucose 65 - 99 mg/dL 133(H)  BUN 6 - 20 mg/dL 17  Creatinine 0.44 - 1.00 mg/dL 0.70  Sodium 135 - 145 mmol/L 137  Potassium 3.5 - 5.1 mmol/L 4.1  Chloride 101 - 111 mmol/L 103  CO2 22 - 32 mmol/L 25  Calcium 8.9 - 10.3 mg/dL 9.0  Total Protein 6.5 - 8.1 g/dL 6.9  Total Bilirubin 0.3 - 1.2 mg/dL 0.3  Alkaline Phos 38 - 126 U/L 38  AST 15 - 41 U/L 25  ALT 14 - 54 U/L 16   CBC Latest Ref Rng & Units 10/14/2016  WBC 3.6 - 11.0 K/uL 5.9  Hemoglobin 12.0 - 16.0 g/dL 13.1  Hematocrit 35.0 - 47.0 % 38.5  Platelets 150 - 440 K/uL 182      Assessment and plan- Patient is a 72 y.o. female with history of breast cancer in 1988 status post bilateral mastectomy with reconstruction.  She then had local recurrence in 2012 status post lumpectomy.  Currently she is off hormone therapy  Patient has been getting mammograms despite bilateral mastectomy as she had local recurrence likely due to residual breast tissue.  Recent mammogram from July 2019 was negative for malignancy and no further mammograms have been recommended.  With regards to hormone therapy I had recommended that she should be on it ideally for 10 years given that she had recurrent breast cancer.  However patient does not wish to go back to AI or tamoxifen at this time.  I will see her back in 1 year for routine follow-up.  No labs  Patient does have baseline osteoporosis and she will be due for repeat bone density scan in April 2020.   Visit Diagnosis 1. Malignant neoplasm of left breast in female, estrogen receptor positive, unspecified site of breast ([place])   2. Osteoporosis without current pathological fracture, unspecified osteoporosis type      Dr. Randa Evens, MD, MPH [facility] [phone] 07/03/2018 12:36 PM

## 2018-07-05 ENCOUNTER — Telehealth: Payer: Self-pay | Admitting: Oncology

## 2018-07-05 NOTE — Telephone Encounter (Signed)
Called patient to confirm Bone Density appt. L/M on V/M with appt info. Appt ltr also mailed.

## 2018-08-07 ENCOUNTER — Telehealth: Payer: Self-pay | Admitting: *Deleted

## 2018-08-07 NOTE — Telephone Encounter (Signed)
Patient reports that 06/29/18 testing was discussed to see if she needs extended therapy with AI, She is asking if insurance investigation of cost was ever done because she has not heard back about it. Please advise

## 2018-08-07 NOTE — Telephone Encounter (Signed)
After speaking to patient she had wanted to get the BCI testing so that if it comes back that she is at high risk for recurrence she will think a little harder before she makes a decision about going back on the AI/tamoxifen.  I talked the patient about the cost and since she has Medicare a and B should be paid at 100% so she is willing to go ahead with the testing.  Patient will be out of town for about 2 to 3 months so we will need to contact her back on her cell phone and it is listed in the computer.

## 2018-08-21 DIAGNOSIS — Z170 Estrogen receptor positive status [ER+]: Secondary | ICD-10-CM | POA: Diagnosis not present

## 2018-08-21 DIAGNOSIS — C50911 Malignant neoplasm of unspecified site of right female breast: Secondary | ICD-10-CM | POA: Diagnosis not present

## 2018-08-22 ENCOUNTER — Encounter: Payer: Self-pay | Admitting: Oncology

## 2018-08-24 ENCOUNTER — Telehealth: Payer: Self-pay | Admitting: *Deleted

## 2018-08-24 NOTE — Telephone Encounter (Signed)
Called patient to let her know that we got the results of her breast cancer index.  Percentage was 4.6 %.  That is a low risk for recurrence of breast cancer and therefore she will not need to take any more of her tamoxifen.  Patient was so happy to know that she does not have to take any of the medications anymore since she is already past 5 years of taking them.

## 2018-11-26 DIAGNOSIS — I10 Essential (primary) hypertension: Secondary | ICD-10-CM | POA: Diagnosis not present

## 2018-11-26 DIAGNOSIS — E7849 Other hyperlipidemia: Secondary | ICD-10-CM | POA: Diagnosis not present

## 2018-11-26 DIAGNOSIS — R5381 Other malaise: Secondary | ICD-10-CM | POA: Diagnosis not present

## 2018-11-30 DIAGNOSIS — M810 Age-related osteoporosis without current pathological fracture: Secondary | ICD-10-CM | POA: Diagnosis not present

## 2018-11-30 DIAGNOSIS — I341 Nonrheumatic mitral (valve) prolapse: Secondary | ICD-10-CM | POA: Diagnosis not present

## 2018-11-30 DIAGNOSIS — I10 Essential (primary) hypertension: Secondary | ICD-10-CM | POA: Diagnosis not present

## 2018-11-30 DIAGNOSIS — R002 Palpitations: Secondary | ICD-10-CM | POA: Diagnosis not present

## 2018-12-20 ENCOUNTER — Encounter: Payer: Self-pay | Admitting: Oncology

## 2019-02-05 ENCOUNTER — Other Ambulatory Visit: Payer: Medicare Other

## 2019-04-15 ENCOUNTER — Other Ambulatory Visit: Payer: Self-pay

## 2019-04-15 ENCOUNTER — Ambulatory Visit
Admission: RE | Admit: 2019-04-15 | Discharge: 2019-04-15 | Disposition: A | Discharge status: Home / Self Care | Payer: Medicare Other | Source: Ambulatory Visit | Attending: Oncology | Admitting: Oncology

## 2019-04-15 DIAGNOSIS — Z170 Estrogen receptor positive status [ER+]: Secondary | ICD-10-CM | POA: Diagnosis not present

## 2019-04-15 DIAGNOSIS — M810 Age-related osteoporosis without current pathological fracture: Secondary | ICD-10-CM | POA: Diagnosis not present

## 2019-04-15 DIAGNOSIS — C50912 Malignant neoplasm of unspecified site of left female breast: Secondary | ICD-10-CM | POA: Insufficient documentation

## 2019-04-16 ENCOUNTER — Telehealth: Payer: Self-pay | Admitting: *Deleted

## 2019-04-16 ENCOUNTER — Other Ambulatory Visit: Payer: Self-pay | Admitting: *Deleted

## 2019-04-16 DIAGNOSIS — M816 Localized osteoporosis [Lequesne]: Secondary | ICD-10-CM

## 2019-04-16 NOTE — Telephone Encounter (Signed)
Called to let patient know that her bone density is worse than it was on last scan. Dr Janese Banks rec: sending her endocrinology. The patient can see the results also and the are not getting any better. The patient is willing to see endocrinology but she has not taken any of the drugs that help due she has TMJ and arthritis of jaw. I told her that she can see what they have to say but with her numbers she is very high risk for fracture. She will go to an appt and I will call and make one.

## 2019-04-16 NOTE — Telephone Encounter (Signed)
-----   Message from Sindy Guadeloupe, MD sent at 04/15/2019  1:37 PM EDT ----- She has bad osteoporosis. Please ask her if she would be willing to see endocrinology for this. Please inform Dr. Lavera Guise as well. Thanks, Astrid Divine

## 2019-04-17 ENCOUNTER — Encounter: Payer: Self-pay | Admitting: *Deleted

## 2019-04-25 DIAGNOSIS — M810 Age-related osteoporosis without current pathological fracture: Secondary | ICD-10-CM | POA: Diagnosis not present

## 2019-05-31 DIAGNOSIS — Z9882 Breast implant status: Secondary | ICD-10-CM | POA: Diagnosis not present

## 2019-05-31 DIAGNOSIS — I10 Essential (primary) hypertension: Secondary | ICD-10-CM | POA: Diagnosis not present

## 2019-05-31 DIAGNOSIS — F419 Anxiety disorder, unspecified: Secondary | ICD-10-CM | POA: Diagnosis not present

## 2019-05-31 DIAGNOSIS — C50919 Malignant neoplasm of unspecified site of unspecified female breast: Secondary | ICD-10-CM | POA: Diagnosis not present

## 2019-07-04 NOTE — Progress Notes (Signed)
Called patient left message

## 2019-07-05 ENCOUNTER — Inpatient Hospital Stay: Payer: Medicare Other | Attending: Oncology | Admitting: Oncology

## 2019-07-05 ENCOUNTER — Encounter: Payer: Self-pay | Admitting: Oncology

## 2019-07-05 ENCOUNTER — Other Ambulatory Visit: Payer: Self-pay

## 2019-07-05 VITALS — BP 184/87 | HR 72 | Temp 96.2°F | Resp 18 | Wt 143.2 lb

## 2019-07-05 DIAGNOSIS — Z87891 Personal history of nicotine dependence: Secondary | ICD-10-CM | POA: Diagnosis not present

## 2019-07-05 DIAGNOSIS — Z79899 Other long term (current) drug therapy: Secondary | ICD-10-CM | POA: Insufficient documentation

## 2019-07-05 DIAGNOSIS — Z803 Family history of malignant neoplasm of breast: Secondary | ICD-10-CM | POA: Insufficient documentation

## 2019-07-05 DIAGNOSIS — Z90710 Acquired absence of both cervix and uterus: Secondary | ICD-10-CM | POA: Insufficient documentation

## 2019-07-05 DIAGNOSIS — Z7982 Long term (current) use of aspirin: Secondary | ICD-10-CM | POA: Insufficient documentation

## 2019-07-05 DIAGNOSIS — M810 Age-related osteoporosis without current pathological fracture: Secondary | ICD-10-CM | POA: Insufficient documentation

## 2019-07-05 DIAGNOSIS — Z923 Personal history of irradiation: Secondary | ICD-10-CM | POA: Insufficient documentation

## 2019-07-05 DIAGNOSIS — Z9013 Acquired absence of bilateral breasts and nipples: Secondary | ICD-10-CM | POA: Insufficient documentation

## 2019-07-05 DIAGNOSIS — Z853 Personal history of malignant neoplasm of breast: Secondary | ICD-10-CM | POA: Insufficient documentation

## 2019-07-05 DIAGNOSIS — I10 Essential (primary) hypertension: Secondary | ICD-10-CM | POA: Insufficient documentation

## 2019-07-05 DIAGNOSIS — Z170 Estrogen receptor positive status [ER+]: Secondary | ICD-10-CM | POA: Diagnosis not present

## 2019-07-05 DIAGNOSIS — Z08 Encounter for follow-up examination after completed treatment for malignant neoplasm: Secondary | ICD-10-CM | POA: Diagnosis not present

## 2019-07-05 NOTE — Progress Notes (Signed)
Pt here for annual follow up. Offers no complaints. States is feeling well.

## 2019-07-08 NOTE — Progress Notes (Signed)
Hematology/Oncology Consult note [facility]  Telephone:([phone] [phone]  Patient Care Team: Cletis Athens, MD as PCP - General (Internal Medicine)   Name of the patient: Deborah Trevino  KG:[id number]  1946/10/11   Date of visit: 07/08/19  Diagnosis- history of invasive ductal carcinoma of the left breast in 1988 status post modified radical mastectomy. Invasive lobular carcinoma stage II in 2012   Chief complaint/ Reason for visit-routine follow-up of breast cancer  Heme/Onc history:  Oncology History Overview Note  1. Invasive lobular carcinoma (June 2 012).  All margins are positive.  Approximate sizes more than 3 cm. size is  approximately 4.5. after re-resection(July, 2012) 2. Previous history of carcinoma of breast in July of 1988, invasive ductal carcinoma in the left breast, status post modified radical mastectomy 3. Prophylactic subcutaneous mastectomy on the right side.  Multifocal ductal carcinoma in situ present. 4. Patient had reconstructive surgery on both sides.(1988) 5. Oncotype DX score (August, 2012) been low risk.  Score of 14. 6. Radiation therapy (right breast) August 2012. 7. Started on Arimidex from September, 2012   Breast cancer [facility])  11/20/1986 Initial Diagnosis   DCIS- left breast   04/21/1987 Surgery   Bilateral mastectomy with reconstruction   05/21/2011 Cancer Diagnosis   Breast cancer - Right breast Invasive lobular carcinoma   05/21/2011 - 06/21/2011 Radiation Therapy     06/25/2011 -  Anti-estrogen oral therapy   with Arimidex      Interval history-patient is doing well since her last visit a year ago.  She has not started taking bisphosphonate yet but she has been seeing endocrinology and contemplating to start bisphosphonate soon  ECOG PS- 1 Pain scale- 0   Review of systems- Review of Systems  Constitutional: Negative for chills, fever, malaise/fatigue and weight loss.  HENT: Negative for  congestion, ear discharge and nosebleeds.   Eyes: Negative for blurred vision.  Respiratory: Negative for cough, hemoptysis, sputum production, shortness of breath and wheezing.   Cardiovascular: Negative for chest pain, palpitations, orthopnea and claudication.  Gastrointestinal: Negative for abdominal pain, blood in stool, constipation, diarrhea, heartburn, melena, nausea and vomiting.  Genitourinary: Negative for dysuria, flank pain, frequency, hematuria and urgency.  Musculoskeletal: Negative for back pain, joint pain and myalgias.  Skin: Negative for rash.  Neurological: Negative for dizziness, tingling, focal weakness, seizures, weakness and headaches.  Endo/Heme/Allergies: Does not bruise/bleed easily.  Psychiatric/Behavioral: Negative for depression and suicidal ideas. The patient does not have insomnia.       Allergies  Allergen Reactions  . Adhesive [Tape] Rash  . Latex Rash     Past Medical History:  Diagnosis Date  . Breast cancer ([place]) 1988   LT MASTECTOMY  . Breast cancer ([place]) 1989   RT LUMPECTOMY  . Breast cancer ([place]) 2012   RT LUMPECTOMY  . Hypertension   . MVP (mitral valve prolapse)      Past Surgical History:  Procedure Laterality Date  . ABDOMINAL HYSTERECTOMY    . AUGMENTATION MAMMAPLASTY Bilateral 1989  . BREAST LUMPECTOMY Right 1989   IN SITU FOUND DURING SUBCUTANEOUS MASTECTOMY  . BREAST LUMPECTOMY Right 2012  . BREAST RECONSTRUCTION Bilateral   . MASTECTOMY Left 1988   BREAST CA  . MASTECTOMY SUBCUTANEOUS Right 1989   BREAST CA FOUND    Social History   Socioeconomic History  . Marital status: Married    Spouse name: Not on file  . Number of children: Not on file  . Years  of education: Not on file  . Highest education level: Not on file  Occupational History  . Not on file  Social Needs  . Financial resource strain: Not on file  . Food insecurity    Worry: Not on file    Inability: Not on file  . Transportation needs     Medical: Not on file    Non-medical: Not on file  Tobacco Use  . Smoking status: Former Research scientist (life sciences)  . Smokeless tobacco: Never Used  Substance and Sexual Activity  . Alcohol use: Yes    Alcohol/week: 2.0 standard drinks    Types: 2 Glasses of wine per week  . Drug use: Not on file  . Sexual activity: Not on file  Lifestyle  . Physical activity    Days per week: Not on file    Minutes per session: Not on file  . Stress: Not on file  Relationships  . Social Herbalist on phone: Not on file    Gets together: Not on file    Attends religious service: Not on file    Active member of club or organization: Not on file    Attends meetings of clubs or organizations: Not on file    Relationship status: Not on file  . Intimate partner violence    Fear of current or ex partner: Not on file    Emotionally abused: Not on file    Physically abused: Not on file    Forced sexual activity: Not on file  Other Topics Concern  . Not on file  Social History Narrative  . Not on file    Family History  Problem Relation Age of Onset  . Breast cancer Sister   . Breast cancer Maternal Aunt   . Breast cancer Paternal Grandmother      Current Outpatient Medications:  .  ALPRAZolam (XANAX) 0.25 MG tablet, TAKE 1 TABLET BY MOUTH AT BEDTIME AS NEEDED FOR ANXIETY, Disp: 30 tablet, Rfl: 0 .  Ascorbic Acid (VITAMIN C) 1000 MG tablet, Take 1,000 mg by mouth daily., Disp: , Rfl:  .  aspirin 81 MG tablet, Take 81 mg by mouth once a week. , Disp: , Rfl:  .  Calcium Carb-Cholecalciferol (CALCIUM-VITAMIN D) 600-400 MG-UNIT TABS, Take 1 tablet by mouth 2 (two) times daily., Disp: , Rfl:  .  cyclobenzaprine (FLEXERIL) 10 MG tablet, Take 10 mg by mouth 3 (three) times daily as needed for muscle spasms., Disp: , Rfl:  .  metoprolol tartrate (LOPRESSOR) 25 MG tablet, Take 25 mg by mouth 2 (two) times daily., Disp: , Rfl:  .  tamoxifen (NOLVADEX) 20 MG tablet, TAKE ONE TABLET BY MOUTH ONCE DAILY (Patient  not taking: Reported on 06/29/2018), Disp: 90 tablet, Rfl: 3  Physical exam:  Vitals:   07/05/19 1416  BP: (!) 184/87  Pulse: 72  Resp: 18  Temp: (!) 96.2 F (35.7 C)  TempSrc: Tympanic  Weight: 143 lb 3.2 oz (65 kg)   Physical Exam Constitutional:      General: She is not in acute distress. HENT:     Head: Normocephalic and atraumatic.  Eyes:     Pupils: Pupils are equal, round, and reactive to light.  Neck:     Musculoskeletal: Normal range of motion.  Cardiovascular:     Rate and Rhythm: Normal rate and regular rhythm.     Heart sounds: Normal heart sounds.  Pulmonary:     Effort: Pulmonary effort is normal.  Breath sounds: Normal breath sounds.  Abdominal:     General: Bowel sounds are normal.     Palpations: Abdomen is soft.  Skin:    General: Skin is warm and dry.  Neurological:     Mental Status: She is alert and oriented to person, place, and time.   Patient is status post bilateral mastectomy with reconstruction.  No evidence of chest wall recurrence.  No evidence of bilateral palpable axillary adenopathy.  CMP Latest Ref Rng & Units 12/28/2017  Glucose 65 - 99 mg/dL 133(H)  BUN 6 - 20 mg/dL 17  Creatinine 0.44 - 1.00 mg/dL 0.70  Sodium 135 - 145 mmol/L 137  Potassium 3.5 - 5.1 mmol/L 4.1  Chloride 101 - 111 mmol/L 103  CO2 22 - 32 mmol/L 25  Calcium 8.9 - 10.3 mg/dL 9.0  Total Protein 6.5 - 8.1 g/dL 6.9  Total Bilirubin 0.3 - 1.2 mg/dL 0.3  Alkaline Phos 38 - 126 U/L 38  AST 15 - 41 U/L 25  ALT 14 - 54 U/L 16   CBC Latest Ref Rng & Units 10/14/2016  WBC 3.6 - 11.0 K/uL 5.9  Hemoglobin 12.0 - 16.0 g/dL 13.1  Hematocrit 35.0 - 47.0 % 38.5  Platelets 150 - 440 K/uL 182      Assessment and plan- Patient is a 73 y.o. female with history of breast cancer in 1988 status post bilateral mastectomy with reconstruction.  She then had local recurrence in 2012 status post lumpectomy.  She completed 5 years of Arimidex and this is a routine follow-up visit   Patient is currently off Arimidex after she completed 5 years.  She no longer requires mammograms at this time as well.  She has undergone bilateral mastectomies with reconstruction in the past but did have a recurrence in 2012 which she had a mammogram up until 2019.  No evidence of chest wall recurrence on today's exam.  Osteoporosis: Patient was recently seen by Dr. Gabriel Carina.  Her worsening osteoporosis was discussed and Prolia was recommended.  Patient is still contemplating whether she wants to go on Prolia or not and will discuss further with Dr. Sharrie Rothman  I will see her back in 1 year no labs for continued surveillance of breast cancer as she wishes to follow-up with [place] for the same   Visit Diagnosis 1. Encounter for follow-up surveillance of breast cancer   2. Age-related osteoporosis without current pathological fracture      Dr. Randa Evens, MD, MPH [facility] at [facility] [id number] 07/08/2019 1:14 PM

## 2019-09-16 ENCOUNTER — Other Ambulatory Visit: Payer: Self-pay

## 2020-03-19 DIAGNOSIS — H25813 Combined forms of age-related cataract, bilateral: Secondary | ICD-10-CM | POA: Diagnosis not present

## 2020-03-31 DIAGNOSIS — H903 Sensorineural hearing loss, bilateral: Secondary | ICD-10-CM | POA: Diagnosis not present

## 2020-03-31 DIAGNOSIS — H9319 Tinnitus, unspecified ear: Secondary | ICD-10-CM | POA: Diagnosis not present

## 2020-06-25 ENCOUNTER — Other Ambulatory Visit: Payer: Self-pay

## 2020-06-25 ENCOUNTER — Other Ambulatory Visit (INDEPENDENT_AMBULATORY_CARE_PROVIDER_SITE_OTHER): Payer: Medicare Other

## 2020-06-25 DIAGNOSIS — M816 Localized osteoporosis [Lequesne]: Secondary | ICD-10-CM | POA: Diagnosis not present

## 2020-06-25 DIAGNOSIS — I10 Essential (primary) hypertension: Secondary | ICD-10-CM

## 2020-06-25 DIAGNOSIS — Z13220 Encounter for screening for lipoid disorders: Secondary | ICD-10-CM | POA: Diagnosis not present

## 2020-06-25 NOTE — Addendum Note (Signed)
Addended by: Alois Cliche on: 06/25/2020 09:34 AM   Modules accepted: Level of Service

## 2020-06-26 LAB — COMPLETE METABOLIC PANEL WITH GFR
AG Ratio: 1.8 (calc) (ref 1.0–2.5)
ALT: 15 U/L (ref 6–29)
AST: 20 U/L (ref 10–35)
Albumin: 4.5 g/dL (ref 3.6–5.1)
Alkaline phosphatase (APISO): 55 U/L (ref 37–153)
BUN: 13 mg/dL (ref 7–25)
CO2: 24 mmol/L (ref 20–32)
Calcium: 9.4 mg/dL (ref 8.6–10.4)
Chloride: 103 mmol/L (ref 98–110)
Creat: 0.62 mg/dL (ref 0.60–0.93)
GFR, Est African American: 103 mL/min/{1.73_m2} (ref >=60)
GFR, Est Non African American: 89 mL/min/{1.73_m2} (ref >=60)
Globulin: 2.5 g/dL (calc) (ref 1.9–3.7)
Glucose, Bld: 79 mg/dL (ref 65–99)
Potassium: 4.5 mmol/L (ref 3.5–5.3)
Sodium: 139 mmol/L (ref 135–146)
Total Bilirubin: 0.5 mg/dL (ref 0.2–1.2)
Total Protein: 7 g/dL (ref 6.1–8.1)

## 2020-06-26 LAB — CBC WITH DIFFERENTIAL/PLATELET
Absolute Monocytes: 384 cells/uL (ref 200–950)
Basophils Absolute: 43 cells/uL (ref 0–200)
Basophils Relative: 0.7 %
Eosinophils Absolute: 415 cells/uL (ref 15–500)
Eosinophils Relative: 6.8 %
HCT: 40.7 % (ref 35.0–45.0)
Hemoglobin: 13.6 g/dL (ref 11.7–15.5)
Lymphs Abs: 1269 cells/uL (ref 850–3900)
MCH: 29.2 pg (ref 27.0–33.0)
MCHC: 33.4 g/dL (ref 32.0–36.0)
MCV: 87.5 fL (ref 80.0–100.0)
MPV: 11.2 fL (ref 7.5–12.5)
Monocytes Relative: 6.3 %
Neutro Abs: 3989 cells/uL (ref 1500–7800)
Neutrophils Relative %: 65.4 %
Platelets: 199 10*3/uL (ref 140–400)
RBC: 4.65 10*6/uL (ref 3.80–5.10)
RDW: 12.9 % (ref 11.0–15.0)
Total Lymphocyte: 20.8 %
WBC: 6.1 10*3/uL (ref 3.8–10.8)

## 2020-06-26 LAB — LIPID PANEL
Cholesterol: 231 mg/dL — ABNORMAL HIGH (ref <200)
HDL: 92 mg/dL (ref >=50)
LDL Cholesterol (Calc): 124 mg/dL (calc) — ABNORMAL HIGH
Non-HDL Cholesterol (Calc): 139 mg/dL (calc) — ABNORMAL HIGH (ref <130)
Total CHOL/HDL Ratio: 2.5 (calc) (ref <5.0)
Triglycerides: 60 mg/dL (ref <150)

## 2020-06-26 LAB — TSH: TSH: 1.21 mIU/L (ref 0.40–4.50)

## 2020-07-02 ENCOUNTER — Ambulatory Visit (INDEPENDENT_AMBULATORY_CARE_PROVIDER_SITE_OTHER): Payer: Medicare Other | Admitting: Internal Medicine

## 2020-07-02 ENCOUNTER — Other Ambulatory Visit: Payer: Self-pay

## 2020-07-02 ENCOUNTER — Encounter: Payer: Self-pay | Admitting: Internal Medicine

## 2020-07-02 VITALS — BP 140/82 | HR 61 | Ht 67.0 in | Wt 145.9 lb

## 2020-07-02 DIAGNOSIS — M816 Localized osteoporosis [Lequesne]: Secondary | ICD-10-CM | POA: Diagnosis not present

## 2020-07-02 DIAGNOSIS — C50912 Malignant neoplasm of unspecified site of left female breast: Secondary | ICD-10-CM

## 2020-07-02 DIAGNOSIS — I10 Essential (primary) hypertension: Secondary | ICD-10-CM | POA: Insufficient documentation

## 2020-07-02 DIAGNOSIS — Z170 Estrogen receptor positive status [ER+]: Secondary | ICD-10-CM | POA: Diagnosis not present

## 2020-07-02 DIAGNOSIS — G43009 Migraine without aura, not intractable, without status migrainosus: Secondary | ICD-10-CM

## 2020-07-02 DIAGNOSIS — I341 Nonrheumatic mitral (valve) prolapse: Secondary | ICD-10-CM | POA: Insufficient documentation

## 2020-07-02 MED ORDER — ALPRAZOLAM 0.25 MG PO TABS: ORAL_TABLET | ORAL | 0 refills | Status: DC

## 2020-07-02 MED ORDER — METOPROLOL TARTRATE 25 MG PO TABS: 25.0000 mg | ORAL_TABLET | Freq: Two times a day (BID) | ORAL | 2 refills | Status: DC

## 2020-07-02 NOTE — Assessment & Plan Note (Signed)
Stable at the present time. 

## 2020-07-02 NOTE — Assessment & Plan Note (Signed)
Patient is on tamoxifen, which will help with osteoporosis

## 2020-07-02 NOTE — Assessment & Plan Note (Signed)
Patient is on tamoxifen 20mg  PO daily.

## 2020-07-02 NOTE — Progress Notes (Signed)
Established Patient Office Visit  SUBJECTIVE:  Subjective  Patient ID: Deborah Trevino, female    DOB: July 10, 1946  Age: 74 y.o. MRN: [id number]  CC:  Chief Complaint  Patient presents with  . Hypertension    follow up    HPI Deborah Trevino is a 74 y.o. female presenting today for a hypertension follow up.  Her blood pressure today is 181/81 by machine and 140/82 manually; she notes that the machine gets too tight on her arm and hurts.   She complains of a migraine, which she had this morning. She notes she'll usually get one around every 6-8 weeks.   Lab work completed 06/25/2020. CBC and BMP was unremarkable. Lipid panel was normal except for cholesterol 231, LDL 124, non-HDL cholesterol 139.    Past Medical History:  Diagnosis Date  . Breast cancer ([place]) 1988   LT MASTECTOMY  . Breast cancer ([place]) 1989   RT LUMPECTOMY  . Breast cancer ([place]) 2012   RT LUMPECTOMY  . Hypertension   . MVP (mitral valve prolapse)     Past Surgical History:  Procedure Laterality Date  . ABDOMINAL HYSTERECTOMY    . AUGMENTATION MAMMAPLASTY Bilateral 1989  . BREAST LUMPECTOMY Right 1989   IN SITU FOUND DURING SUBCUTANEOUS MASTECTOMY  . BREAST LUMPECTOMY Right 2012  . BREAST RECONSTRUCTION Bilateral   . MASTECTOMY Left 1988   BREAST CA  . MASTECTOMY SUBCUTANEOUS Right 1989   BREAST CA FOUND    Family History  Problem Relation Age of Onset  . Breast cancer Sister   . Breast cancer Maternal Aunt   . Breast cancer Paternal Grandmother     Social History   Socioeconomic History  . Marital status: Married    Spouse name: Not on file  . Number of children: Not on file  . Years of education: Not on file  . Highest education level: Not on file  Occupational History  . Not on file  Tobacco Use  . Smoking status: Former Research scientist (life sciences)  . Smokeless tobacco: Never Used  Vaping Use  . Vaping Use: Never used  Substance and Sexual Activity  . Alcohol use: Yes    Alcohol/week: 2.0  standard drinks    Types: 2 Glasses of wine per week  . Drug use: Not on file  . Sexual activity: Not on file  Other Topics Concern  . Not on file  Social History Narrative  . Not on file   Social Determinants of Health   Financial Resource Strain:   . Difficulty of Paying Living Expenses: Not on file  Food Insecurity:   . Worried About Charity fundraiser in the Last Year: Not on file  . Ran Out of Food in the Last Year: Not on file  Transportation Needs:   . Lack of Transportation (Medical): Not on file  . Lack of Transportation (Non-Medical): Not on file  Physical Activity:   . Days of Exercise per Week: Not on file  . Minutes of Exercise per Session: Not on file  Stress:   . Feeling of Stress : Not on file  Social Connections:   . Frequency of Communication with Friends and Family: Not on file  . Frequency of Social Gatherings with Friends and Family: Not on file  . Attends Religious Services: Not on file  . Active Member of Clubs or Organizations: Not on file  . Attends Archivist Meetings: Not on file  . Marital Status: Not on file  Intimate  Partner Violence:   . Fear of Current or Ex-Partner: Not on file  . Emotionally Abused: Not on file  . Physically Abused: Not on file  . Sexually Abused: Not on file     Current Outpatient Medications:  .  ALPRAZolam (XANAX) 0.25 MG tablet, TAKE 1 TABLET BY MOUTH AT BEDTIME AS NEEDED FOR ANXIETY, Disp: 30 tablet, Rfl: 0 .  Ascorbic Acid (VITAMIN C) 1000 MG tablet, Take 1,000 mg by mouth daily., Disp: , Rfl:  .  Calcium Carb-Cholecalciferol (CALCIUM-VITAMIN D) 600-400 MG-UNIT TABS, Take 1 tablet by mouth 2 (two) times daily., Disp: , Rfl:  .  metoprolol tartrate (LOPRESSOR) 25 MG tablet, Take 1 tablet (25 mg total) by mouth 2 (two) times daily., Disp: 180 tablet, Rfl: 2 .  aspirin 81 MG tablet, Take 81 mg by mouth once a week.  (Patient not taking: Reported on 07/02/2020), Disp: , Rfl:  .  cyclobenzaprine (FLEXERIL) 10 MG  tablet, Take 10 mg by mouth 3 (three) times daily as needed for muscle spasms. (Patient not taking: Reported on 07/02/2020), Disp: , Rfl:  .  tamoxifen (NOLVADEX) 20 MG tablet, TAKE ONE TABLET BY MOUTH ONCE DAILY (Patient not taking: Reported on 06/29/2018), Disp: 90 tablet, Rfl: 3   Allergies  Allergen Reactions  . Adhesive [Tape] Rash  . Latex Rash    ROS Review of Systems  Constitutional: Negative.   HENT: Negative.   Eyes: Negative.   Respiratory: Negative.  Negative for cough and shortness of breath.   Cardiovascular: Negative.  Negative for chest pain.  Gastrointestinal: Negative.   Endocrine: Negative.   Genitourinary: Negative.   Musculoskeletal: Negative.   Skin: Negative.   Allergic/Immunologic: Negative.   Neurological: Positive for headaches.  Hematological: Negative.   Psychiatric/Behavioral: Negative.   All other systems reviewed and are negative.    OBJECTIVE:    Physical Exam Vitals reviewed.  Constitutional:      Appearance: Normal appearance.  HENT:     Mouth/Throat:     Mouth: Mucous membranes are moist.  Eyes:     Pupils: Pupils are equal, round, and reactive to light.  Neck:     Vascular: No carotid bruit.  Cardiovascular:     Rate and Rhythm: Normal rate and regular rhythm.     Pulses: Normal pulses.     Heart sounds: Normal heart sounds.  Pulmonary:     Effort: Pulmonary effort is normal.     Breath sounds: Normal breath sounds.  Abdominal:     General: Bowel sounds are normal.     Palpations: Abdomen is soft. There is no hepatomegaly, splenomegaly or mass.     Tenderness: There is no abdominal tenderness.     Hernia: No hernia is present.  Musculoskeletal:        General: No tenderness.     Cervical back: Neck supple.     Right lower leg: No edema.     Left lower leg: No edema.  Skin:    Findings: No rash.  Neurological:     Mental Status: She is alert and oriented to person, place, and time.     Motor: No weakness.  Psychiatric:          Mood and Affect: Mood and affect normal.        Behavior: Behavior normal.     BP 140/82   Pulse 61   Ht 5\' 7"  (1.702 m)   Wt 145 lb 14.4 oz (66.2 kg)   BMI 22.85 kg/m  Wt Readings from Last 3 Encounters:  07/02/20 145 lb 14.4 oz (66.2 kg)  07/05/19 143 lb 3.2 oz (65 kg)  06/29/18 140 lb 4.8 oz (63.6 kg)    Health Maintenance Due  Topic Date Due  . Hepatitis C Screening  Never done  . COVID-19 Vaccine (1) Never done  . TETANUS/TDAP  Never done  . COLONOSCOPY  Never done  . PNA vac Low Risk Adult (1 of 2 - PCV13) Never done  . MAMMOGRAM  05/14/2020  . INFLUENZA VACCINE  05/24/2020    There are no preventive care reminders to display for this patient.  CBC Latest Ref Rng & Units 06/25/2020 10/14/2016 03/22/2016  WBC 3.8 - 10.8 Thousand/uL 6.1 5.9 6.3  Hemoglobin 11.7 - 15.5 g/dL 13.6 13.1 13.9  Hematocrit 35 - 45 % 40.7 38.5 40.7  Platelets 140 - 400 Thousand/uL 199 182 202   CMP Latest Ref Rng & Units 06/25/2020 12/28/2017 12/16/2016  Glucose 65 - 99 mg/dL 79 133(H) 106(H)  BUN 7 - 25 mg/dL 13 17 17   Creatinine 0.60 - 0.93 mg/dL 0.62 0.70 0.53  Sodium 135 - 146 mmol/L 139 137 134(L)  Potassium 3.5 - 5.3 mmol/L 4.5 4.1 4.2  Chloride 98 - 110 mmol/L 103 103 102  CO2 20 - 32 mmol/L 24 25 25   Calcium 8.6 - 10.4 mg/dL 9.4 9.0 9.0  Total Protein 6.1 - 8.1 g/dL 7.0 6.9 7.2  Total Bilirubin 0.2 - 1.2 mg/dL 0.5 0.3 0.6  Alkaline Phos 38 - 126 U/L - 38 48  AST 10 - 35 U/L 20 25 18   ALT 6 - 29 U/L 15 16 12(L)    Lab Results  Component Value Date   TSH 1.21 06/25/2020   Lab Results  Component Value Date   ALBUMIN 4.2 12/28/2017   ANIONGAP 9 12/28/2017   Lab Results  Component Value Date   CHOL 231 (H) 06/25/2020   CHOL 229 (H) 07/22/2015   CHOL 244 (H) 01/08/2013   HDL 92 06/25/2020   HDL 69 07/22/2015   HDL 82 (H) 01/08/2013   LDLCALC 124 (H) 06/25/2020   LDLCALC 152 (H) 07/22/2015   LDLCALC 148 (H) 01/08/2013   CHOLHDL 2.5 06/25/2020   CHOLHDL 3.3 07/22/2015    Lab Results  Component Value Date   TRIG 60 06/25/2020   No results found for: HGBA1C    ASSESSMENT & PLAN:   Problem List Items Addressed This Visit      Cardiovascular and Mediastinum   MVP (mitral valve prolapse)    Stable at the present time.       Relevant Medications   metoprolol tartrate (LOPRESSOR) 25 MG tablet   Migraine without aura and without status migrainosus, not intractable    Migrain is stable on beta blockers and flexeril.      Relevant Medications   metoprolol tartrate (LOPRESSOR) 25 MG tablet     Musculoskeletal and Integument   Localized osteoporosis without current pathological fracture - Primary    Patient is on tamoxifen, which will help with osteoporosis        Other   Breast cancer ([place])    Patient is on tamoxifen 20mg  PO daily.       Relevant Medications   ALPRAZolam (XANAX) 0.25 MG tablet      Meds ordered this encounter  Medications  . ALPRAZolam (XANAX) 0.25 MG tablet    Sig: TAKE 1 TABLET BY MOUTH AT BEDTIME AS NEEDED FOR ANXIETY    Dispense:  30 tablet    Refill:  0  . metoprolol tartrate (LOPRESSOR) 25 MG tablet    Sig: Take 1 tablet (25 mg total) by mouth 2 (two) times daily.    Dispense:  180 tablet    Refill:  2    Follow-up: Return in about 3 months (around 10/01/2020).    Cletis Athens, MD [facility] [address], [place],  [address]   By signing my name below, I, [employer], attest that this documentation has been prepared under the direction and in the presence of Dr. Cletis Athens Electronically Signed: Cletis Athens, MD 07/02/20, 5:23 PM  I personally performed the services described in this documentation, which was SCRIBED in my presence. The recorded information has been reviewed and considered accurate. It has been edited as necessary during review. Cletis Athens, MD

## 2020-07-02 NOTE — Assessment & Plan Note (Signed)
Migrain is stable on beta blockers and flexeril.

## 2020-07-03 ENCOUNTER — Telehealth: Payer: Self-pay | Admitting: Oncology

## 2020-07-03 NOTE — Telephone Encounter (Signed)
Writer spoke with patient on this date and moved patient's appt to 07-09-20. Patient is in agreement with moving appt.

## 2020-07-06 ENCOUNTER — Inpatient Hospital Stay: Payer: Medicare Other | Admitting: Oncology

## 2020-07-09 ENCOUNTER — Inpatient Hospital Stay: Payer: Medicare Other | Attending: Oncology | Admitting: Oncology

## 2020-07-09 ENCOUNTER — Other Ambulatory Visit: Payer: Self-pay

## 2020-07-09 ENCOUNTER — Encounter: Payer: Self-pay | Admitting: Oncology

## 2020-07-09 VITALS — BP 122/76 | HR 55 | Temp 97.9°F | Resp 16 | Ht 67.0 in | Wt 147.7 lb

## 2020-07-09 DIAGNOSIS — Z08 Encounter for follow-up examination after completed treatment for malignant neoplasm: Secondary | ICD-10-CM

## 2020-07-09 DIAGNOSIS — Z853 Personal history of malignant neoplasm of breast: Secondary | ICD-10-CM | POA: Diagnosis not present

## 2020-07-09 DIAGNOSIS — Z9013 Acquired absence of bilateral breasts and nipples: Secondary | ICD-10-CM | POA: Insufficient documentation

## 2020-07-09 DIAGNOSIS — Z9223 Personal history of estrogen therapy: Secondary | ICD-10-CM | POA: Diagnosis not present

## 2020-07-09 NOTE — Progress Notes (Signed)
No breast concerns

## 2020-07-10 NOTE — Progress Notes (Signed)
Hematology/Oncology Consult note [facility]  Telephone:([phone] [phone]  Patient Care Team: Cletis Athens, MD as PCP - General (Internal Medicine)   Name of the patient: Deborah Trevino  [id number]  02-Jun-1946   Date of visit: 07/10/20  Diagnosis- history of invasive ductal carcinoma of the left breast in 1988 status post modified radical mastectomy. Invasive lobular carcinoma stage II in 2012  Chief complaint/ Reason for visit-routine follow-up of breast cancer  Heme/Onc history:  Oncology History Overview Note  1. Invasive lobular carcinoma (June 2 012).  All margins are positive.  Approximate sizes more than 3 cm. size is  approximately 4.5. after re-resection(July, 2012) 2. Previous history of carcinoma of breast in July of 1988, invasive ductal carcinoma in the left breast, status post modified radical mastectomy 3. Prophylactic subcutaneous mastectomy on the right side.  Multifocal ductal carcinoma in situ present. 4. Patient had reconstructive surgery on both sides.(1988) 5. Oncotype DX score (August, 2012) been low risk.  Score of 14. 6. Radiation therapy (right breast) August 2012. 7. Started on Arimidex from September, 2012   Breast cancer [facility])  11/20/1986 Initial Diagnosis   DCIS- left breast   04/21/1987 Surgery   Bilateral mastectomy with reconstruction   05/21/2011 Cancer Diagnosis   Breast cancer - Right breast Invasive lobular carcinoma   05/21/2011 - 06/21/2011 Radiation Therapy     06/25/2011 -  Anti-estrogen oral therapy   with Arimidex      Interval history-patient is doing well for her age. Denies any new aches and pains anywhere. Appetite and weight has remained stable.  ECOG PS- 1 Pain scale- 0   Review of systems- Review of Systems  Constitutional: Negative for chills, fever, malaise/fatigue and weight loss.  HENT: Negative for congestion, ear discharge and nosebleeds.   Eyes: Negative for blurred  vision.  Respiratory: Negative for cough, hemoptysis, sputum production, shortness of breath and wheezing.   Cardiovascular: Negative for chest pain, palpitations, orthopnea and claudication.  Gastrointestinal: Negative for abdominal pain, blood in stool, constipation, diarrhea, heartburn, melena, nausea and vomiting.  Genitourinary: Negative for dysuria, flank pain, frequency, hematuria and urgency.  Musculoskeletal: Negative for back pain, joint pain and myalgias.  Skin: Negative for rash.  Neurological: Negative for dizziness, tingling, focal weakness, seizures, weakness and headaches.  Endo/Heme/Allergies: Does not bruise/bleed easily.  Psychiatric/Behavioral: Negative for depression and suicidal ideas. The patient does not have insomnia.       Allergies  Allergen Reactions  . Adhesive [Tape] Rash  . Latex Rash     Past Medical History:  Diagnosis Date  . Breast cancer ([place]) 1988   LT MASTECTOMY  . Breast cancer ([place]) 1989   RT LUMPECTOMY  . Breast cancer ([place]) 2012   RT LUMPECTOMY  . Hypertension   . MVP (mitral valve prolapse)      Past Surgical History:  Procedure Laterality Date  . ABDOMINAL HYSTERECTOMY    . AUGMENTATION MAMMAPLASTY Bilateral 1989  . BREAST LUMPECTOMY Right 1989   IN SITU FOUND DURING SUBCUTANEOUS MASTECTOMY  . BREAST LUMPECTOMY Right 2012  . BREAST RECONSTRUCTION Bilateral   . MASTECTOMY Left 1988   BREAST CA  . MASTECTOMY SUBCUTANEOUS Right 1989   BREAST CA FOUND    Social History   Socioeconomic History  . Marital status: Married    Spouse name: Not on file  . Number of children: Not on file  . Years of education: Not on file  . Highest education level: Not on  file  Occupational History  . Not on file  Tobacco Use  . Smoking status: Former Research scientist (life sciences)  . Smokeless tobacco: Never Used  Vaping Use  . Vaping Use: Never used  Substance and Sexual Activity  . Alcohol use: Yes    Alcohol/week: 0.0 standard drinks    Comment: 1 drink a  month  . Drug use: Never  . Sexual activity: Not on file  Other Topics Concern  . Not on file  Social History Narrative  . Not on file   Social Determinants of Health   Financial Resource Strain:   . Difficulty of Paying Living Expenses: Not on file  Food Insecurity:   . Worried About Charity fundraiser in the Last Year: Not on file  . Ran Out of Food in the Last Year: Not on file  Transportation Needs:   . Lack of Transportation (Medical): Not on file  . Lack of Transportation (Non-Medical): Not on file  Physical Activity:   . Days of Exercise per Week: Not on file  . Minutes of Exercise per Session: Not on file  Stress:   . Feeling of Stress : Not on file  Social Connections:   . Frequency of Communication with Friends and Family: Not on file  . Frequency of Social Gatherings with Friends and Family: Not on file  . Attends Religious Services: Not on file  . Active Member of Clubs or Organizations: Not on file  . Attends Archivist Meetings: Not on file  . Marital Status: Not on file  Intimate Partner Violence:   . Fear of Current or Ex-Partner: Not on file  . Emotionally Abused: Not on file  . Physically Abused: Not on file  . Sexually Abused: Not on file    Family History  Problem Relation Age of Onset  . Breast cancer Sister   . Breast cancer Maternal Aunt   . Breast cancer Paternal Grandmother      Current Outpatient Medications:  .  ALPRAZolam (XANAX) 0.25 MG tablet, TAKE 1 TABLET BY MOUTH AT BEDTIME AS NEEDED FOR ANXIETY, Disp: 30 tablet, Rfl: 0 .  Ascorbic Acid (VITAMIN C) 1000 MG tablet, Take 1,000 mg by mouth daily., Disp: , Rfl:  .  aspirin 81 MG tablet, Take 81 mg by mouth 2 (two) times a week. , Disp: , Rfl:  .  Calcium Carb-Cholecalciferol (CALCIUM-VITAMIN D) 600-400 MG-UNIT TABS, Take 1 tablet by mouth 2 (two) times daily., Disp: , Rfl:  .  Magnesium ([employer] TRIPLE MAGNESIUM COMPLEX) 400 MG CAPS, Take 1 capsule by mouth daily., Disp: , Rfl:  .   metoprolol tartrate (LOPRESSOR) 25 MG tablet, Take 1 tablet (25 mg total) by mouth 2 (two) times daily., Disp: 180 tablet, Rfl: 2 .  pyridoxine (B-6) 100 MG tablet, Take 100 mg by mouth in the morning and at bedtime., Disp: , Rfl:  .  vitamin B-12 (CYANOCOBALAMIN) 1000 MCG tablet, Take 1,000 mcg by mouth in the morning and at bedtime., Disp: , Rfl:  .  cyclobenzaprine (FLEXERIL) 10 MG tablet, Take 10 mg by mouth 3 (three) times daily as needed for muscle spasms. (Patient not taking: Reported on 07/02/2020), Disp: , Rfl:   Physical exam:  Vitals:   07/09/20 0851  BP: 122/76  Pulse: (!) 55  Resp: 16  Temp: 97.9 F (36.6 C)  TempSrc: Oral  Weight: 147 lb 11.2 oz (67 kg)  Height: 5\' 7"  (1.702 m)   Physical Exam Constitutional:  General: She is not in acute distress. Cardiovascular:     Rate and Rhythm: Normal rate and regular rhythm.     Heart sounds: Normal heart sounds.  Pulmonary:     Effort: Pulmonary effort is normal.     Breath sounds: Normal breath sounds.  Abdominal:     General: Bowel sounds are normal.     Palpations: Abdomen is soft.  Skin:    General: Skin is warm and dry.  Neurological:     Mental Status: She is alert and oriented to person, place, and time.   Breast exam: Patient is s/p bilateral mastectomy with reconstruction. No evidence of chest wall recurrence. No palpable bilateral axillary adenopathy.   CMP Latest Ref Rng & Units 06/25/2020  Glucose 65 - 99 mg/dL 79  BUN 7 - 25 mg/dL 13  Creatinine 0.60 - 0.93 mg/dL 0.62  Sodium 135 - 146 mmol/L 139  Potassium 3.5 - 5.3 mmol/L 4.5  Chloride 98 - 110 mmol/L 103  CO2 20 - 32 mmol/L 24  Calcium 8.6 - 10.4 mg/dL 9.4  Total Protein 6.1 - 8.1 g/dL 7.0  Total Bilirubin 0.2 - 1.2 mg/dL 0.5  Alkaline Phos 38 - 126 U/L -  AST 10 - 35 U/L 20  ALT 6 - 29 U/L 15   CBC Latest Ref Rng & Units 06/25/2020  WBC 3.8 - 10.8 Thousand/uL 6.1  Hemoglobin 11.7 - 15.5 g/dL 13.6  Hematocrit 35 - 45 % 40.7  Platelets 140 -  400 Thousand/uL 199    Assessment and plan- Patient is a 74 y.o. female with history of breast cancer in 1988 status post bilateral mastectomy with reconstruction. She then had local recurrence in 2012 status post lumpectomy.  She has completed 5 years of Arimidex and this is a routine follow-up visit  Clinically patient is doing well with no concerning signs and symptoms of recurrence based on today's exam.Her last breast cancer diagnosis was in 2012.  She has undergone bilateral mastectomies with reconstruction in the past but had a recurrence in 2012 for which she had a mammogram in 2019.  No concerning findings of recurrence on today's exam.  She wishes to follow-up with oncology once a year #4 we will see her back in 1 year with labs    Visit Diagnosis 1. Encounter for follow-up surveillance of breast cancer      Dr. Randa Evens, MD, MPH [facility] at [facility] [phone] 07/10/2020 1:33 PM

## 2020-07-20 DIAGNOSIS — Z23 Encounter for immunization: Secondary | ICD-10-CM | POA: Diagnosis not present

## 2020-11-11 ENCOUNTER — Encounter: Payer: Self-pay | Admitting: Internal Medicine

## 2020-11-11 ENCOUNTER — Ambulatory Visit (INDEPENDENT_AMBULATORY_CARE_PROVIDER_SITE_OTHER): Payer: Medicare Other | Admitting: Internal Medicine

## 2020-11-11 VITALS — BP 120/82 | HR 84 | Ht 67.0 in | Wt 154.0 lb

## 2020-11-11 DIAGNOSIS — F419 Anxiety disorder, unspecified: Secondary | ICD-10-CM

## 2020-11-11 DIAGNOSIS — Z170 Estrogen receptor positive status [ER+]: Secondary | ICD-10-CM | POA: Diagnosis not present

## 2020-11-11 DIAGNOSIS — Z853 Personal history of malignant neoplasm of breast: Secondary | ICD-10-CM

## 2020-11-11 DIAGNOSIS — I10 Essential (primary) hypertension: Secondary | ICD-10-CM

## 2020-11-11 DIAGNOSIS — I341 Nonrheumatic mitral (valve) prolapse: Secondary | ICD-10-CM

## 2020-11-11 DIAGNOSIS — C50912 Malignant neoplasm of unspecified site of left female breast: Secondary | ICD-10-CM

## 2020-11-11 DIAGNOSIS — M816 Localized osteoporosis [Lequesne]: Secondary | ICD-10-CM | POA: Diagnosis not present

## 2020-11-11 MED ORDER — ALPRAZOLAM 0.25 MG PO TABS: ORAL_TABLET | ORAL | 0 refills | Status: DC

## 2020-11-11 NOTE — Progress Notes (Signed)
Established Patient Office Visit  Subjective:  Patient ID: Deborah Trevino, female    DOB: September 10, 1946  Age: 75 y.o. MRN: [id number]  CC:  Chief Complaint  Patient presents with  . Anxiety    Needs refill on xanax     HPI  [place] S [place] Trevino presents foranxiety patient is known to have mitral prolapse she denies any history of chest pain or shortness of breath  Past Medical History:  Diagnosis Date  . Breast cancer ([place]) 1988   LT MASTECTOMY  . Breast cancer ([place]) 1989   RT LUMPECTOMY  . Breast cancer ([place]) 2012   RT LUMPECTOMY  . Hypertension   . MVP (mitral valve prolapse)     Past Surgical History:  Procedure Laterality Date  . ABDOMINAL HYSTERECTOMY    . AUGMENTATION MAMMAPLASTY Bilateral 1989  . BREAST LUMPECTOMY Right 1989   IN SITU FOUND DURING SUBCUTANEOUS MASTECTOMY  . BREAST LUMPECTOMY Right 2012  . BREAST RECONSTRUCTION Bilateral   . MASTECTOMY Left 1988   BREAST CA  . MASTECTOMY SUBCUTANEOUS Right 1989   BREAST CA FOUND    Family History  Problem Relation Age of Onset  . Breast cancer Sister   . Breast cancer Maternal Aunt   . Breast cancer Paternal Grandmother     Social History   Socioeconomic History  . Marital status: Married    Spouse name: Not on file  . Number of children: Not on file  . Years of education: Not on file  . Highest education level: Not on file  Occupational History  . Not on file  Tobacco Use  . Smoking status: Former Research scientist (life sciences)  . Smokeless tobacco: Never Used  Vaping Use  . Vaping Use: Never used  Substance and Sexual Activity  . Alcohol use: Yes    Alcohol/week: 0.0 standard drinks    Comment: 1 drink a month  . Drug use: Never  . Sexual activity: Not on file  Other Topics Concern  . Not on file  Social History Narrative  . Not on file   Social Determinants of Health   Financial Resource Strain: Not on file  Food Insecurity: Not on file  Transportation Needs: Not on file  Physical Activity: Not on  file  Stress: Not on file  Social Connections: Not on file  Intimate Partner Violence: Not on file     Current Outpatient Medications:  .  Ascorbic Acid (VITAMIN C) 1000 MG tablet, Take 1,000 mg by mouth daily., Disp: , Rfl:  .  aspirin 81 MG tablet, Take 81 mg by mouth 2 (two) times a week. , Disp: , Rfl:  .  Calcium Carb-Cholecalciferol (CALCIUM-VITAMIN D) 600-400 MG-UNIT TABS, Take 1 tablet by mouth 2 (two) times daily., Disp: , Rfl:  .  cyclobenzaprine (FLEXERIL) 10 MG tablet, Take 10 mg by mouth 3 (three) times daily as needed for muscle spasms., Disp: , Rfl:  .  Magnesium ([employer] TRIPLE MAGNESIUM COMPLEX) 400 MG CAPS, Take 1 capsule by mouth daily., Disp: , Rfl:  .  metoprolol tartrate (LOPRESSOR) 25 MG tablet, Take 1 tablet (25 mg total) by mouth 2 (two) times daily., Disp: 180 tablet, Rfl: 2 .  pyridoxine (B-6) 100 MG tablet, Take 100 mg by mouth in the morning and at bedtime., Disp: , Rfl:  .  vitamin B-12 (CYANOCOBALAMIN) 1000 MCG tablet, Take 1,000 mcg by mouth in the morning and at bedtime., Disp: , Rfl:  .  ALPRAZolam (XANAX) 0.25 MG tablet, TAKE 1 TABLET BY  MOUTH AT BEDTIME AS NEEDED FOR ANXIETY, Disp: 30 tablet, Rfl: 0   Allergies  Allergen Reactions  . Adhesive [Tape] Rash  . Latex Rash    ROS Review of Systems  Constitutional: Negative.   HENT: Negative.   Eyes: Negative.   Respiratory: Negative.   Cardiovascular: Negative.   Gastrointestinal: Negative.   Endocrine: Negative.   Genitourinary: Negative.   Musculoskeletal: Negative.   Skin: Negative.   Allergic/Immunologic: Negative.   Neurological: Negative.   Hematological: Negative.   Psychiatric/Behavioral: Negative.   All other systems reviewed and are negative.     Objective:    Physical Exam Vitals reviewed.  Constitutional:      Appearance: Normal appearance.  HENT:     Mouth/Throat:     Mouth: Mucous membranes are moist.  Eyes:     Pupils: Pupils are equal, round, and reactive to light.   Neck:     Vascular: No carotid bruit.  Cardiovascular:     Rate and Rhythm: Normal rate and regular rhythm.     Pulses: Normal pulses.     Heart sounds: Normal heart sounds.  Pulmonary:     Effort: Pulmonary effort is normal.     Breath sounds: Normal breath sounds.  Abdominal:     General: Bowel sounds are normal.     Palpations: Abdomen is soft. There is no hepatomegaly, splenomegaly or mass.     Tenderness: There is no abdominal tenderness.     Hernia: No hernia is present.  Musculoskeletal:        General: No tenderness.     Cervical back: Neck supple.     Right lower leg: No edema.     Left lower leg: No edema.  Skin:    Findings: No rash.  Neurological:     Mental Status: She is alert and oriented to person, place, and time.     Motor: No weakness.  Psychiatric:        Mood and Affect: Mood and affect normal.        Behavior: Behavior normal.     BP 120/82   Pulse 84   Ht 5\' 7"  (1.702 m)   Wt 154 lb (69.9 kg)   BMI 24.12 kg/m  Wt Readings from Last 3 Encounters:  11/11/20 154 lb (69.9 kg)  07/09/20 147 lb 11.2 oz (67 kg)  07/02/20 145 lb 14.4 oz (66.2 kg)     Health Maintenance Due  Topic Date Due  . Hepatitis C Screening  Never done  . TETANUS/TDAP  Never done  . COLONOSCOPY (Pts 45-54yrs Insurance coverage will need to be confirmed)  Never done  . PNA vac Low Risk Adult (1 of 2 - PCV13) Never done  . COVID-19 Vaccine (3 - Inadvertent risk 4-dose series) 01/16/2020  . INFLUENZA VACCINE  05/24/2020    There are no preventive care reminders to display for this patient.  Lab Results  Component Value Date   TSH 1.21 06/25/2020   Lab Results  Component Value Date   WBC 6.1 06/25/2020   HGB 13.6 06/25/2020   HCT 40.7 06/25/2020   MCV 87.5 06/25/2020   PLT 199 06/25/2020   Lab Results  Component Value Date   NA 139 06/25/2020   K 4.5 06/25/2020   CO2 24 06/25/2020   GLUCOSE 79 06/25/2020   BUN 13 06/25/2020   CREATININE 0.62 06/25/2020    BILITOT 0.5 06/25/2020   ALKPHOS 38 12/28/2017   AST 20 06/25/2020   ALT 15  06/25/2020   PROT 7.0 06/25/2020   ALBUMIN 4.2 12/28/2017   CALCIUM 9.4 06/25/2020   ANIONGAP 9 12/28/2017   Lab Results  Component Value Date   CHOL 231 (H) 06/25/2020   Lab Results  Component Value Date   HDL 92 06/25/2020   Lab Results  Component Value Date   LDLCALC 124 (H) 06/25/2020   Lab Results  Component Value Date   TRIG 60 06/25/2020   Lab Results  Component Value Date   CHOLHDL 2.5 06/25/2020   No results found for: HGBA1C    Assessment & Plan:   Problem List Items Addressed This Visit      Cardiovascular and Mediastinum   MVP (mitral valve prolapse)    Patient denies any palpitation no chest pain      Hypertension    Blood pressure is under control on the present medication.  Heart is regular chest is clear abdomen is nontender without any bruit.        Musculoskeletal and Integument   Localized osteoporosis without current pathological fracture    Patient has mild osteoporosis. she takes vitamin D and calcium for that.        Other   Breast cancer [facility])    Patient is being followed up by oncologist on a regular basis she is not on any hormonal therapy      Relevant Medications   ALPRAZolam (XANAX) 0.25 MG tablet    Other Visit Diagnoses    Anxiety    -  Primary   Relevant Medications   ALPRAZolam (XANAX) 0.25 MG tablet      Meds ordered this encounter  Medications  . ALPRAZolam (XANAX) 0.25 MG tablet    Sig: TAKE 1 TABLET BY MOUTH AT BEDTIME AS NEEDED FOR ANXIETY    Dispense:  30 tablet    Refill:  0    Follow-up: No follow-ups on file.  COVID guidelines discussed Cletis Athens, MD

## 2020-11-11 NOTE — Assessment & Plan Note (Signed)
Patient is being followed up by oncologist on a regular basis she is not on any hormonal therapy

## 2020-11-11 NOTE — Assessment & Plan Note (Signed)
Patient has mild osteoporosis. she takes vitamin D and calcium for that.

## 2020-11-11 NOTE — Progress Notes (Signed)
a 

## 2020-11-11 NOTE — Assessment & Plan Note (Signed)
Patient denies any palpitation no chest pain

## 2020-11-11 NOTE — Assessment & Plan Note (Signed)
Blood pressure is under control on the present medication.  Heart is regular chest is clear abdomen is nontender without any bruit.

## 2021-03-10 ENCOUNTER — Ambulatory Visit (INDEPENDENT_AMBULATORY_CARE_PROVIDER_SITE_OTHER): Payer: Medicare Other | Admitting: Internal Medicine

## 2021-03-10 ENCOUNTER — Other Ambulatory Visit: Payer: Self-pay

## 2021-03-10 ENCOUNTER — Encounter: Payer: Self-pay | Admitting: Internal Medicine

## 2021-03-10 VITALS — BP 145/86 | HR 86 | Ht 67.0 in | Wt 146.3 lb

## 2021-03-10 DIAGNOSIS — G43009 Migraine without aura, not intractable, without status migrainosus: Secondary | ICD-10-CM | POA: Diagnosis not present

## 2021-03-10 DIAGNOSIS — I10 Essential (primary) hypertension: Secondary | ICD-10-CM

## 2021-03-10 DIAGNOSIS — I341 Nonrheumatic mitral (valve) prolapse: Secondary | ICD-10-CM

## 2021-03-10 DIAGNOSIS — M816 Localized osteoporosis [Lequesne]: Secondary | ICD-10-CM

## 2021-03-10 DIAGNOSIS — F419 Anxiety disorder, unspecified: Secondary | ICD-10-CM

## 2021-03-10 MED ORDER — METOPROLOL TARTRATE 25 MG PO TABS: 25.0000 mg | ORAL_TABLET | Freq: Two times a day (BID) | ORAL | 2 refills | Status: DC

## 2021-03-10 MED ORDER — ALPRAZOLAM 0.25 MG PO TABS: ORAL_TABLET | ORAL | 0 refills | Status: DC

## 2021-03-10 NOTE — Assessment & Plan Note (Signed)
Migraine headache is stable at the present time.

## 2021-03-10 NOTE — Assessment & Plan Note (Signed)
Patient blood pressure is normal patient denies any chest pain or shortness of breath there is no history of palpitation or paroxysmal nocturnal dyspnea   patient was advised to follow low-salt low-cholesterol diet    ideally I want to keep systolic blood pressure below 130 mmHg, patient was asked to check blood pressure one times a week and give me a report on that.  Patient will be follow-up in 3 months  or earlier as needed, patient will call me back for any change in the cardiovascular symptoms    

## 2021-03-10 NOTE — Assessment & Plan Note (Signed)
Heart is regular chest is clear there is no pedal edema.  Patient does not give any history of palpitation.

## 2021-03-10 NOTE — Progress Notes (Signed)
Established Patient Office Visit  Subjective:  Patient ID: Deborah Trevino, female    DOB: 12/23/1945  Age: 75 y.o. MRN: [id number]  CC:  Chief Complaint  Patient presents with  . Anxiety    HPI  Deborah Trevino presents for anxiety.  She is not depressed.  She denies any chest pain or shortness of breath.  There is no palpitation or hyperventilation spell.  She does not smoke does not drink.  Patient was advised to take calcium and vitamin D every day.  Patient will do bone density test in about 2 years.  Her thyroid function test is normal.  Past Medical History:  Diagnosis Date  . Breast cancer ([place]) 1988   LT MASTECTOMY  . Breast cancer ([place]) 1989   RT LUMPECTOMY  . Breast cancer ([place]) 2012   RT LUMPECTOMY  . Hypertension   . MVP (mitral valve prolapse)     Past Surgical History:  Procedure Laterality Date  . ABDOMINAL HYSTERECTOMY    . AUGMENTATION MAMMAPLASTY Bilateral 1989  . BREAST LUMPECTOMY Right 1989   IN SITU FOUND DURING SUBCUTANEOUS MASTECTOMY  . BREAST LUMPECTOMY Right 2012  . BREAST RECONSTRUCTION Bilateral   . MASTECTOMY Left 1988   BREAST CA  . MASTECTOMY SUBCUTANEOUS Right 1989   BREAST CA FOUND    Family History  Problem Relation Age of Onset  . Breast cancer Sister   . Breast cancer Maternal Aunt   . Breast cancer Paternal Grandmother     Social History   Socioeconomic History  . Marital status: Married    Spouse name: Not on file  . Number of children: Not on file  . Years of education: Not on file  . Highest education level: Not on file  Occupational History  . Not on file  Tobacco Use  . Smoking status: Former Research scientist (life sciences)  . Smokeless tobacco: Never Used  Vaping Use  . Vaping Use: Never used  Substance and Sexual Activity  . Alcohol use: Yes    Alcohol/week: 0.0 standard drinks    Comment: 1 drink a month  . Drug use: Never  . Sexual activity: Not on file  Other Topics Concern  . Not on file  Social History Narrative   . Not on file   Social Determinants of Health   Financial Resource Strain: Not on file  Food Insecurity: Not on file  Transportation Needs: Not on file  Physical Activity: Not on file  Stress: Not on file  Social Connections: Not on file  Intimate Partner Violence: Not on file     Current Outpatient Medications:  .  Ascorbic Acid (VITAMIN C) 1000 MG tablet, Take 1,000 mg by mouth daily., Disp: , Rfl:  .  aspirin 81 MG tablet, Take 81 mg by mouth 2 (two) times a week. , Disp: , Rfl:  .  Calcium Carb-Cholecalciferol (CALCIUM-VITAMIN D) 600-400 MG-UNIT TABS, Take 1 tablet by mouth 2 (two) times daily., Disp: , Rfl:  .  cyclobenzaprine (FLEXERIL) 10 MG tablet, Take 10 mg by mouth 3 (three) times daily as needed for muscle spasms., Disp: , Rfl:  .  Magnesium ([employer] TRIPLE MAGNESIUM COMPLEX) 400 MG CAPS, Take 1 capsule by mouth daily., Disp: , Rfl:  .  pyridoxine (B-6) 100 MG tablet, Take 100 mg by mouth in the morning and at bedtime., Disp: , Rfl:  .  vitamin B-12 (CYANOCOBALAMIN) 1000 MCG tablet, Take 1,000 mcg by mouth in the morning and at bedtime., Disp: , Rfl:  .  ALPRAZolam (XANAX) 0.25 MG tablet, TAKE 1 TABLET BY MOUTH AT BEDTIME AS NEEDED FOR ANXIETY, Disp: 30 tablet, Rfl: 0 .  metoprolol tartrate (LOPRESSOR) 25 MG tablet, Take 1 tablet (25 mg total) by mouth 2 (two) times daily., Disp: 180 tablet, Rfl: 2   Allergies  Allergen Reactions  . Adhesive [Tape] Rash  . Latex Rash    ROS Review of Systems  Constitutional: Negative.   HENT: Negative.   Eyes: Negative.   Respiratory: Negative.   Cardiovascular: Negative.   Gastrointestinal: Negative.   Endocrine: Negative.   Genitourinary: Negative.   Musculoskeletal: Negative.   Skin: Negative.   Allergic/Immunologic: Negative.   Neurological: Negative.   Hematological: Negative.   Psychiatric/Behavioral: Negative.   All other systems reviewed and are negative.     Objective:    Physical Exam Vitals reviewed.   Constitutional:      Appearance: Normal appearance.  HENT:     Mouth/Throat:     Mouth: Mucous membranes are moist.  Eyes:     Pupils: Pupils are equal, round, and reactive to light.  Neck:     Vascular: No carotid bruit.  Cardiovascular:     Rate and Rhythm: Normal rate and regular rhythm.     Pulses: Normal pulses.     Heart sounds: Normal heart sounds.  Pulmonary:     Effort: Pulmonary effort is normal.     Breath sounds: Normal breath sounds.  Abdominal:     General: Bowel sounds are normal.     Palpations: Abdomen is soft. There is no hepatomegaly, splenomegaly or mass.     Tenderness: There is no abdominal tenderness.     Hernia: No hernia is present.  Musculoskeletal:        General: No tenderness.     Cervical back: Neck supple.     Right lower leg: No edema.     Left lower leg: No edema.  Skin:    Findings: No rash.  Neurological:     Mental Status: She is alert and oriented to person, place, and time.     Motor: No weakness.  Psychiatric:        Mood and Affect: Mood and affect normal.        Behavior: Behavior normal.     BP (!) 145/86   Pulse 86   Ht 5\' 7"  (1.702 m)   Wt 146 lb 4.8 oz (66.4 kg)   BMI 22.91 kg/m  Wt Readings from Last 3 Encounters:  03/10/21 146 lb 4.8 oz (66.4 kg)  11/11/20 154 lb (69.9 kg)  07/09/20 147 lb 11.2 oz (67 kg)     Health Maintenance Due  Topic Date Due  . Hepatitis C Screening  Never done  . TETANUS/TDAP  Never done  . COLONOSCOPY (Pts 45-77yrs Insurance coverage will need to be confirmed)  Never done  . PNA vac Low Risk Adult (1 of 2 - PCV13) Never done  . COVID-19 Vaccine (3 - Inadvertent risk 4-dose series) 01/16/2020    There are no preventive care reminders to display for this patient.  Lab Results  Component Value Date   TSH 1.21 06/25/2020   Lab Results  Component Value Date   WBC 6.1 06/25/2020   HGB 13.6 06/25/2020   HCT 40.7 06/25/2020   MCV 87.5 06/25/2020   PLT 199 06/25/2020   Lab Results   Component Value Date   NA 139 06/25/2020   K 4.5 06/25/2020   CO2 24 06/25/2020   GLUCOSE  79 06/25/2020   BUN 13 06/25/2020   CREATININE 0.62 06/25/2020   BILITOT 0.5 06/25/2020   ALKPHOS 38 12/28/2017   AST 20 06/25/2020   ALT 15 06/25/2020   PROT 7.0 06/25/2020   ALBUMIN 4.2 12/28/2017   CALCIUM 9.4 06/25/2020   ANIONGAP 9 12/28/2017   Lab Results  Component Value Date   CHOL 231 (H) 06/25/2020   Lab Results  Component Value Date   HDL 92 06/25/2020   Lab Results  Component Value Date   LDLCALC 124 (H) 06/25/2020   Lab Results  Component Value Date   TRIG 60 06/25/2020   Lab Results  Component Value Date   CHOLHDL 2.5 06/25/2020   No results found for: HGBA1C    Assessment & Plan:   Problem List Items Addressed This Visit      Cardiovascular and Mediastinum   MVP (mitral valve prolapse) - Primary    Heart is regular chest is clear there is no pedal edema.  Patient does not give any history of palpitation.      Relevant Medications   metoprolol tartrate (LOPRESSOR) 25 MG tablet   Migraine without aura and without status migrainosus, not intractable    Migraine headache is stable at the present time.      Relevant Medications   metoprolol tartrate (LOPRESSOR) 25 MG tablet   Hypertension    Patient blood pressure is normal patient denies any chest pain or shortness of breath there is no history of palpitation or paroxysmal nocturnal dyspnea   patient was advised to follow low-salt low-cholesterol diet    ideally I want to keep systolic blood pressure below 130 mmHg, patient was asked to check blood pressure one times a week and give me a report on that.  Patient will be follow-up in 3 months  or earlier as needed, patient will call me back for any change in the cardiovascular symptoms         Relevant Medications   metoprolol tartrate (LOPRESSOR) 25 MG tablet     Musculoskeletal and Integument   Localized osteoporosis without current pathological  fracture    Patient was advised to take vitamin D every day 2000 units.  And take calcium       Other Visit Diagnoses    Anxiety       Relevant Medications   ALPRAZolam (XANAX) 0.25 MG tablet    - Patient experiencing high levels of anxiety.  - Encouraged patient to engage in relaxing activities like yoga, meditation, journaling, going for a walk, or participating in a hobby.  - Encouraged patient to reach out to trusted friends or family members about recent struggles  Meds ordered this encounter  Medications  . ALPRAZolam (XANAX) 0.25 MG tablet    Sig: TAKE 1 TABLET BY MOUTH AT BEDTIME AS NEEDED FOR ANXIETY    Dispense:  30 tablet    Refill:  0  . metoprolol tartrate (LOPRESSOR) 25 MG tablet    Sig: Take 1 tablet (25 mg total) by mouth 2 (two) times daily.    Dispense:  180 tablet    Refill:  2    Follow-up: No follow-ups on file.    Cletis Athens, MD

## 2021-03-10 NOTE — Assessment & Plan Note (Signed)
Patient was advised to take vitamin D every day 2000 units.  And take calcium

## 2021-04-28 DIAGNOSIS — Z20822 Contact with and (suspected) exposure to covid-19: Secondary | ICD-10-CM | POA: Diagnosis not present

## 2021-06-02 ENCOUNTER — Other Ambulatory Visit: Payer: Self-pay

## 2021-06-02 ENCOUNTER — Other Ambulatory Visit: Payer: Medicare Other

## 2021-06-08 DIAGNOSIS — H25813 Combined forms of age-related cataract, bilateral: Secondary | ICD-10-CM | POA: Diagnosis not present

## 2021-06-09 ENCOUNTER — Encounter: Payer: Self-pay | Admitting: Internal Medicine

## 2021-06-09 ENCOUNTER — Other Ambulatory Visit: Payer: Self-pay

## 2021-06-09 ENCOUNTER — Ambulatory Visit (INDEPENDENT_AMBULATORY_CARE_PROVIDER_SITE_OTHER): Payer: Medicare Other | Admitting: Internal Medicine

## 2021-06-09 VITALS — BP 118/70 | HR 59 | Ht 67.0 in | Wt 147.0 lb

## 2021-06-09 DIAGNOSIS — Z0000 Encounter for general adult medical examination without abnormal findings: Secondary | ICD-10-CM

## 2021-06-09 DIAGNOSIS — H2513 Age-related nuclear cataract, bilateral: Secondary | ICD-10-CM

## 2021-06-09 DIAGNOSIS — I341 Nonrheumatic mitral (valve) prolapse: Secondary | ICD-10-CM

## 2021-06-09 DIAGNOSIS — M816 Localized osteoporosis [Lequesne]: Secondary | ICD-10-CM

## 2021-06-09 DIAGNOSIS — G43009 Migraine without aura, not intractable, without status migrainosus: Secondary | ICD-10-CM

## 2021-06-09 DIAGNOSIS — I10 Essential (primary) hypertension: Secondary | ICD-10-CM | POA: Diagnosis not present

## 2021-06-09 NOTE — Assessment & Plan Note (Signed)
Patient is not having any tachycardia

## 2021-06-09 NOTE — Assessment & Plan Note (Signed)
Stable at the present time. 

## 2021-06-09 NOTE — Assessment & Plan Note (Signed)

## 2021-06-09 NOTE — Progress Notes (Addendum)
Established Patient Office Visit  Subjective:  Patient ID: Deborah Trevino, female    DOB: [birth year]  Age: 75 y.o. MRN: [id number]  CC:  Chief Complaint  Patient presents with   Annual Exam    HPI  Deborah Trevino presents for physical  Past Medical History:  Diagnosis Date   Breast cancer ([place]) 1988   LT MASTECTOMY   Breast cancer ([place]) 1989   RT LUMPECTOMY   Breast cancer ([place]) 2012   RT LUMPECTOMY   Hypertension    MVP (mitral valve prolapse)     Past Surgical History:  Procedure Laterality Date   ABDOMINAL HYSTERECTOMY     AUGMENTATION MAMMAPLASTY Bilateral 1989   BREAST LUMPECTOMY Right 1989   IN SITU FOUND DURING SUBCUTANEOUS MASTECTOMY   BREAST LUMPECTOMY Right 2012   BREAST RECONSTRUCTION Bilateral    MASTECTOMY Left 1988   BREAST CA   MASTECTOMY SUBCUTANEOUS Right 1989   BREAST CA FOUND    Family History  Problem Relation Age of Onset   Breast cancer Sister    Breast cancer Maternal Aunt    Breast cancer Paternal Grandmother     Social History   Socioeconomic History   Marital status: Married    Spouse name: Not on file   Number of children: Not on file   Years of education: Not on file   Highest education level: Not on file  Occupational History   Not on file  Tobacco Use   Smoking status: Former   Smokeless tobacco: Never  Vaping Use   Vaping Use: Never used  Substance and Sexual Activity   Alcohol use: Yes    Alcohol/week: 0.0 standard drinks    Comment: 1 drink a month   Drug use: Never   Sexual activity: Not on file  Other Topics Concern   Not on file  Social History Narrative   Not on file   Social Determinants of Health   Financial Resource Strain: Not on file  Food Insecurity: Not on file  Transportation Needs: Not on file  Physical Activity: Not on file  Stress: Not on file  Social Connections: Not on file  Intimate Partner Violence: Not on file     Current Outpatient Medications:    ALPRAZolam (XANAX)  0.25 MG tablet, TAKE 1 TABLET BY MOUTH AT BEDTIME AS NEEDED FOR ANXIETY, Disp: 30 tablet, Rfl: 0   Ascorbic Acid (VITAMIN C) 1000 MG tablet, Take 1,000 mg by mouth daily., Disp: , Rfl:    aspirin 81 MG tablet, Take 81 mg by mouth 2 (two) times a week. , Disp: , Rfl:    Calcium Carb-Cholecalciferol (CALCIUM-VITAMIN D) 600-400 MG-UNIT TABS, Take 1 tablet by mouth 2 (two) times daily., Disp: , Rfl:    Magnesium ([employer]S TRIPLE MAGNESIUM COMPLEX) 400 MG CAPS, Take 1 capsule by mouth daily., Disp: , Rfl:    metoprolol tartrate (LOPRESSOR) 25 MG tablet, Take 1 tablet (25 mg total) by mouth 2 (two) times daily., Disp: 180 tablet, Rfl: 2   pyridoxine (B-6) 100 MG tablet, Take 100 mg by mouth in the morning and at bedtime., Disp: , Rfl:    vitamin B-12 (CYANOCOBALAMIN) 1000 MCG tablet, Take 1,000 mcg by mouth in the morning and at bedtime., Disp: , Rfl:    Allergies  Allergen Reactions   Adhesive [Tape] Rash   Latex Rash    ROS Review of Systems  Constitutional: Negative.   HENT: Negative.    Eyes: Negative.   Respiratory: Negative.  Cardiovascular: Negative.   Gastrointestinal: Negative.   Endocrine: Negative.   Genitourinary: Negative.   Musculoskeletal: Negative.   Skin: Negative.   Allergic/Immunologic: Negative.   Neurological: Negative.   Hematological: Negative.   Psychiatric/Behavioral: Negative.    All other systems reviewed and are negative.    Objective:    Physical Exam Vitals reviewed.  Constitutional:      Appearance: Normal appearance.  HENT:     Mouth/Throat:     Mouth: Mucous membranes are moist.  Eyes:     Pupils: Pupils are equal, round, and reactive to light.  Neck:     Vascular: No carotid bruit.  Cardiovascular:     Rate and Rhythm: Normal rate and regular rhythm.     Pulses: Normal pulses.     Heart sounds: Normal heart sounds.  Pulmonary:     Effort: Pulmonary effort is normal.     Breath sounds: Normal breath sounds.  Abdominal:     General: Bowel  sounds are normal.     Palpations: Abdomen is soft. There is no hepatomegaly, splenomegaly or mass.     Tenderness: There is no abdominal tenderness.     Hernia: No hernia is present.  Musculoskeletal:        General: No tenderness.     Cervical back: Neck supple.     Right lower leg: No edema.     Left lower leg: No edema.  Skin:    Findings: No rash.  Neurological:     Mental Status: She is alert and oriented to person, place, and time.     Motor: No weakness.  Psychiatric:        Mood and Affect: Mood and affect normal.        Behavior: Behavior normal.    BP 118/70   Pulse (!) 59   Ht '5\' 7"'$  (1.702 m)   Wt 147 lb (66.7 kg)   BMI 23.02 kg/m  Wt Readings from Last 3 Encounters:  06/09/21 147 lb (66.7 kg)  03/10/21 146 lb 4.8 oz (66.4 kg)  11/11/20 154 lb (69.9 kg)     Health Maintenance Due  Topic Date Due   Hepatitis C Screening  Never done   TETANUS/TDAP  Never done   Zoster Vaccines- Shingrix (1 of 2) Never done   COLONOSCOPY (Pts 45-55yr Insurance coverage will need to be confirmed)  Never done   PNA vac Low Risk Adult (2 of 2 - PCV13) 10/01/2013   INFLUENZA VACCINE  05/24/2021    There are no preventive care reminders to display for this patient.  Lab Results  Component Value Date   TSH 1.21 06/25/2020   Lab Results  Component Value Date   WBC 6.1 06/25/2020   HGB 13.6 06/25/2020   HCT 40.7 06/25/2020   MCV 87.5 06/25/2020   PLT 199 06/25/2020   Lab Results  Component Value Date   NA 139 06/25/2020   K 4.5 06/25/2020   CO2 24 06/25/2020   GLUCOSE 79 06/25/2020   BUN 13 06/25/2020   CREATININE 0.62 06/25/2020   BILITOT 0.5 06/25/2020   ALKPHOS 38 12/28/2017   AST 20 06/25/2020   ALT 15 06/25/2020   PROT 7.0 06/25/2020   ALBUMIN 4.2 12/28/2017   CALCIUM 9.4 06/25/2020   ANIONGAP 9 12/28/2017   Lab Results  Component Value Date   CHOL 231 (H) 06/25/2020   Lab Results  Component Value Date   HDL 92 06/25/2020   Lab Results   Component Value  Date   LDLCALC 124 (H) 06/25/2020   Lab Results  Component Value Date   TRIG 60 06/25/2020   Lab Results  Component Value Date   CHOLHDL 2.5 06/25/2020   No results found for: HGBA1C    Assessment & Plan:   Problem List Items Addressed This Visit       Cardiovascular and Mediastinum   MVP (mitral valve prolapse) - Primary    Patient is not having any tachycardia      Migraine without aura and without status migrainosus, not intractable    Stable at the present time      Hypertension     Patient denies any chest pain or shortness of breath there is no history of palpitation or paroxysmal nocturnal dyspnea   patient was advised to follow low-salt low-cholesterol diet    ideally I want to keep systolic blood pressure below 130 mmHg, patient was asked to check blood pressure one times a week and give me a report on that.  Patient will be follow-up in 3 months  or earlier as needed, patient will call me back for any change in the cardiovascular symptoms Patient was advised to buy a book from local bookstore concerning blood pressure and read several chapters  every day.  This will be supplemented by some of the material we will give him from the office.  Patient should also utilize other resources like YouTube and Internet to learn more about the blood pressure and the diet.        Musculoskeletal and Integument   Localized osteoporosis without current pathological fracture    Instructions given for osteoporosis        Other   Age-related nuclear cataract of both eyes   Encounter for preventive care    Patient physical examination is normal she denies any chest pain or shortness of breath heart is regular there is no rub chest is clear on auscultation resonant on percussion abdomen is soft nontender without any hepatosplenomegaly       No orders of the defined types were placed in this encounter.   Follow-up: Return in about 3 months (around  09/09/2021).    Cletis Athens, MD

## 2021-06-09 NOTE — Assessment & Plan Note (Signed)
Instructions given for osteoporosis

## 2021-06-18 DIAGNOSIS — H2512 Age-related nuclear cataract, left eye: Secondary | ICD-10-CM | POA: Diagnosis not present

## 2021-06-18 DIAGNOSIS — H2513 Age-related nuclear cataract, bilateral: Secondary | ICD-10-CM | POA: Diagnosis not present

## 2021-06-21 DIAGNOSIS — Z0000 Encounter for general adult medical examination without abnormal findings: Secondary | ICD-10-CM | POA: Insufficient documentation

## 2021-06-21 NOTE — Assessment & Plan Note (Signed)
Patient physical examination is normal she denies any chest pain or shortness of breath heart is regular there is no rub chest is clear on auscultation resonant on percussion abdomen is soft nontender without any hepatosplenomegaly

## 2021-07-06 ENCOUNTER — Ambulatory Visit (INDEPENDENT_AMBULATORY_CARE_PROVIDER_SITE_OTHER): Payer: Medicare Other | Admitting: Internal Medicine

## 2021-07-06 ENCOUNTER — Other Ambulatory Visit: Payer: Self-pay

## 2021-07-06 DIAGNOSIS — I10 Essential (primary) hypertension: Secondary | ICD-10-CM | POA: Diagnosis not present

## 2021-07-06 DIAGNOSIS — Z0000 Encounter for general adult medical examination without abnormal findings: Secondary | ICD-10-CM

## 2021-07-07 LAB — COMPLETE METABOLIC PANEL WITH GFR
AG Ratio: 2.1 (calc) (ref 1.0–2.5)
ALT: 16 U/L (ref 6–29)
AST: 22 U/L (ref 10–35)
Albumin: 4.4 g/dL (ref 3.6–5.1)
Alkaline phosphatase (APISO): 55 U/L (ref 37–153)
BUN: 11 mg/dL (ref 7–25)
CO2: 25 mmol/L (ref 20–32)
Calcium: 9.1 mg/dL (ref 8.6–10.4)
Chloride: 101 mmol/L (ref 98–110)
Creat: 0.67 mg/dL (ref 0.60–1.00)
Globulin: 2.1 g/dL (calc) (ref 1.9–3.7)
Glucose, Bld: 90 mg/dL (ref 65–99)
Potassium: 4.5 mmol/L (ref 3.5–5.3)
Sodium: 137 mmol/L (ref 135–146)
Total Bilirubin: 0.6 mg/dL (ref 0.2–1.2)
Total Protein: 6.5 g/dL (ref 6.1–8.1)
eGFR: 91 mL/min/{1.73_m2} (ref >=60)

## 2021-07-07 LAB — CBC WITH DIFFERENTIAL/PLATELET
Absolute Monocytes: 402 cells/uL (ref 200–950)
Basophils Absolute: 33 cells/uL (ref 0–200)
Basophils Relative: 0.6 %
Eosinophils Absolute: 363 cells/uL (ref 15–500)
Eosinophils Relative: 6.6 %
HCT: 39.6 % (ref 35.0–45.0)
Hemoglobin: 13.1 g/dL (ref 11.7–15.5)
Lymphs Abs: 968 cells/uL (ref 850–3900)
MCH: 29.8 pg (ref 27.0–33.0)
MCHC: 33.1 g/dL (ref 32.0–36.0)
MCV: 90 fL (ref 80.0–100.0)
MPV: 11.6 fL (ref 7.5–12.5)
Monocytes Relative: 7.3 %
Neutro Abs: 3735 cells/uL (ref 1500–7800)
Neutrophils Relative %: 67.9 %
Platelets: 195 10*3/uL (ref 140–400)
RBC: 4.4 10*6/uL (ref 3.80–5.10)
RDW: 12.7 % (ref 11.0–15.0)
Total Lymphocyte: 17.6 %
WBC: 5.5 10*3/uL (ref 3.8–10.8)

## 2021-07-07 LAB — LIPID PANEL
Cholesterol: 229 mg/dL — ABNORMAL HIGH (ref <200)
HDL: 95 mg/dL (ref >=50)
LDL Cholesterol (Calc): 120 mg/dL (calc) — ABNORMAL HIGH
Non-HDL Cholesterol (Calc): 134 mg/dL (calc) — ABNORMAL HIGH (ref <130)
Total CHOL/HDL Ratio: 2.4 (calc) (ref <5.0)
Triglycerides: 58 mg/dL (ref <150)

## 2021-07-07 LAB — TSH: TSH: 1.28 mIU/L (ref 0.40–4.50)

## 2021-07-12 ENCOUNTER — Inpatient Hospital Stay: Payer: Medicare Other | Attending: Oncology | Admitting: Oncology

## 2021-07-12 ENCOUNTER — Encounter: Payer: Self-pay | Admitting: Oncology

## 2021-07-12 ENCOUNTER — Other Ambulatory Visit: Payer: Self-pay

## 2021-07-12 VITALS — BP 136/68 | HR 53 | Temp 98.0°F | Resp 16 | Wt 148.1 lb

## 2021-07-12 DIAGNOSIS — Z87891 Personal history of nicotine dependence: Secondary | ICD-10-CM | POA: Insufficient documentation

## 2021-07-12 DIAGNOSIS — Z7982 Long term (current) use of aspirin: Secondary | ICD-10-CM | POA: Insufficient documentation

## 2021-07-12 DIAGNOSIS — Z923 Personal history of irradiation: Secondary | ICD-10-CM | POA: Insufficient documentation

## 2021-07-12 DIAGNOSIS — M810 Age-related osteoporosis without current pathological fracture: Secondary | ICD-10-CM | POA: Insufficient documentation

## 2021-07-12 DIAGNOSIS — Z9013 Acquired absence of bilateral breasts and nipples: Secondary | ICD-10-CM | POA: Diagnosis not present

## 2021-07-12 DIAGNOSIS — Z79899 Other long term (current) drug therapy: Secondary | ICD-10-CM | POA: Diagnosis not present

## 2021-07-12 DIAGNOSIS — Z803 Family history of malignant neoplasm of breast: Secondary | ICD-10-CM | POA: Insufficient documentation

## 2021-07-12 DIAGNOSIS — Z853 Personal history of malignant neoplasm of breast: Secondary | ICD-10-CM | POA: Insufficient documentation

## 2021-07-12 DIAGNOSIS — I10 Essential (primary) hypertension: Secondary | ICD-10-CM | POA: Insufficient documentation

## 2021-07-12 DIAGNOSIS — Z08 Encounter for follow-up examination after completed treatment for malignant neoplasm: Secondary | ICD-10-CM

## 2021-07-12 NOTE — Progress Notes (Signed)
Hematology/Oncology Consult note [facility]  Telephone:([phone] [phone]  Patient Care Team: Cletis Athens, MD as PCP - General (Internal Medicine)   Name of the patient: Deborah Trevino  [id number]  07-03-1946   Date of visit: 07/12/21  Diagnosis- history of invasive ductal carcinoma of the left breast in 1988 status post modified radical mastectomy. Invasive lobular carcinoma stage II in 2012  Chief complaint/ Reason for visit-routine follow-up of breast cancer  Heme/Onc history:  Oncology History Overview Note  1. Invasive lobular carcinoma (June 2 012).  All margins are positive.  Approximate sizes more than 3 cm. size is  approximately 4.5. after re-resection(July, 2012) 2. Previous history of carcinoma of breast in July of 1988, invasive ductal carcinoma in the left breast, status post modified radical mastectomy 3. Prophylactic subcutaneous mastectomy on the right side.  Multifocal ductal carcinoma in situ present. 4. Patient had reconstructive surgery on both sides.(1988) 5. Oncotype DX score (August, 2012) been low risk.  Score of 14. 6. Radiation therapy (right breast) August 2012. 7. Started on Arimidex from September, 2012   Breast cancer [facility])  11/20/1986 Initial Diagnosis   DCIS- left breast   04/21/1987 Surgery   Bilateral mastectomy with reconstruction   05/21/2011 Cancer Diagnosis   Breast cancer - Right breast Invasive lobular carcinoma   05/21/2011 - 06/21/2011 Radiation Therapy     06/25/2011 -  Anti-estrogen oral therapy   with Arimidex      Interval history-patient reports doing well overall.  Denies any changes in her appetite or weight.  Denies any recent falls.  ECOG PS- 1 Pain scale- 0   Review of systems- Review of Systems  Constitutional:  Negative for chills, fever, malaise/fatigue and weight loss.  HENT:  Negative for congestion, ear discharge and nosebleeds.   Eyes:  Negative for blurred vision.   Respiratory:  Negative for cough, hemoptysis, sputum production, shortness of breath and wheezing.   Cardiovascular:  Negative for chest pain, palpitations, orthopnea and claudication.  Gastrointestinal:  Negative for abdominal pain, blood in stool, constipation, diarrhea, heartburn, melena, nausea and vomiting.  Genitourinary:  Negative for dysuria, flank pain, frequency, hematuria and urgency.  Musculoskeletal:  Negative for back pain, joint pain and myalgias.  Skin:  Negative for rash.  Neurological:  Negative for dizziness, tingling, focal weakness, seizures, weakness and headaches.  Endo/Heme/Allergies:  Does not bruise/bleed easily.  Psychiatric/Behavioral:  Negative for depression and suicidal ideas. The patient does not have insomnia.       Allergies  Allergen Reactions   Adhesive [Tape] Rash   Latex Rash     Past Medical History:  Diagnosis Date   Breast cancer ([place]) 1988   LT MASTECTOMY   Breast cancer ([place]) 1989   RT LUMPECTOMY   Breast cancer ([place]) 2012   RT LUMPECTOMY   Hypertension    MVP (mitral valve prolapse)      Past Surgical History:  Procedure Laterality Date   ABDOMINAL HYSTERECTOMY     AUGMENTATION MAMMAPLASTY Bilateral 1989   BREAST LUMPECTOMY Right 1989   IN SITU FOUND DURING SUBCUTANEOUS MASTECTOMY   BREAST LUMPECTOMY Right 2012   BREAST RECONSTRUCTION Bilateral    MASTECTOMY Left 1988   BREAST CA   MASTECTOMY SUBCUTANEOUS Right 1989   BREAST CA FOUND    Social History   Socioeconomic History   Marital status: Married    Spouse name: Not on file   Number of children: Not on file   Years of  education: Not on file   Highest education level: Not on file  Occupational History   Not on file  Tobacco Use   Smoking status: Former   Smokeless tobacco: Never  Vaping Use   Vaping Use: Never used  Substance and Sexual Activity   Alcohol use: Yes    Alcohol/week: 0.0 standard drinks    Comment: 1 drink a month   Drug use: Never    Sexual activity: Not on file  Other Topics Concern   Not on file  Social History Narrative   Not on file   Social Determinants of Health   Financial Resource Strain: Not on file  Food Insecurity: Not on file  Transportation Needs: Not on file  Physical Activity: Not on file  Stress: Not on file  Social Connections: Not on file  Intimate Partner Violence: Not on file    Family History  Problem Relation Age of Onset   Breast cancer Sister    Breast cancer Maternal Aunt    Breast cancer Paternal Grandmother      Current Outpatient Medications:    ALPRAZolam (XANAX) 0.25 MG tablet, TAKE 1 TABLET BY MOUTH AT BEDTIME AS NEEDED FOR ANXIETY, Disp: 30 tablet, Rfl: 0   Ascorbic Acid (VITAMIN C) 1000 MG tablet, Take 1,000 mg by mouth daily., Disp: , Rfl:    aspirin 81 MG tablet, Take 81 mg by mouth 2 (two) times a week. , Disp: , Rfl:    Calcium Carb-Cholecalciferol (CALCIUM-VITAMIN D) 600-400 MG-UNIT TABS, Take 1 tablet by mouth 2 (two) times daily., Disp: , Rfl:    calcium carbonate (OSCAL) 1500 (600 Ca) MG TABS tablet, Take by mouth., Disp: , Rfl:    Magnesium ([employer]S TRIPLE MAGNESIUM COMPLEX) 400 MG CAPS, Take 1 capsule by mouth daily., Disp: , Rfl:    metoprolol tartrate (LOPRESSOR) 25 MG tablet, Take 1 tablet (25 mg total) by mouth 2 (two) times daily., Disp: 180 tablet, Rfl: 2   PRED FORTE 1 % ophthalmic suspension, Place 1 drop into the right eye 4 (four) times daily., Disp: , Rfl:    pyridoxine (B-6) 100 MG tablet, Take 100 mg by mouth in the morning and at bedtime., Disp: , Rfl:    vitamin B-12 (CYANOCOBALAMIN) 1000 MCG tablet, Take 1,000 mcg by mouth in the morning and at bedtime., Disp: , Rfl:   Physical exam:  Vitals:   07/12/21 1000  BP: 136/68  Pulse: (!) 53  Resp: 16  Temp: 98 F (36.7 C)  SpO2: 100%  Weight: 148 lb 1.6 oz (67.2 kg)   Physical Exam Constitutional:      General: She is not in acute distress. Cardiovascular:     Rate and Rhythm: Normal rate and  regular rhythm.     Heart sounds: Normal heart sounds.  Pulmonary:     Effort: Pulmonary effort is normal.     Breath sounds: Normal breath sounds.  Abdominal:     General: Bowel sounds are normal.     Palpations: Abdomen is soft.  Skin:    General: Skin is warm and dry.  Neurological:     Mental Status: She is alert and oriented to person, place, and time.  Chest wall exam: Patient is s/p bilateral mastectomy with reconstruction.  No evidence of chest wall recurrence.  No palpable bilateral axillary adenopathy.  CMP Latest Ref Rng & Units 07/06/2021  Glucose 65 - 99 mg/dL 90  BUN 7 - 25 mg/dL 11  Creatinine 0.60 - 1.00 mg/dL 0.67  Sodium 135 - 146 mmol/L 137  Potassium 3.5 - 5.3 mmol/L 4.5  Chloride 98 - 110 mmol/L 101  CO2 20 - 32 mmol/L 25  Calcium 8.6 - 10.4 mg/dL 9.1  Total Protein 6.1 - 8.1 g/dL 6.5  Total Bilirubin 0.2 - 1.2 mg/dL 0.6  Alkaline Phos 38 - 126 U/L -  AST 10 - 35 U/L 22  ALT 6 - 29 U/L 16   CBC Latest Ref Rng & Units 07/06/2021  WBC 3.8 - 10.8 Thousand/uL 5.5  Hemoglobin 11.7 - 15.5 g/dL 13.1  Hematocrit 35.0 - 45.0 % 39.6  Platelets 140 - 400 Thousand/uL 195     Assessment and plan- Patient is a 75 y.o. female with history of breast cancer in 1988 status post bilateral mastectomy with reconstruction.  She then had local recurrence in 2012 status post lumpectomy.  She is here for routine follow-up  It has been 10 years since patient's breast cancer diagnosis.  She is s/p bilateral mastectomy with reconstruction.  She does not require any surveillance imaging at this time.  She is also completed 5 years of Arimidex and not presently on any hormone therapy.  She therefore does not require any follow-up with oncology at this time and can be referred to [place] in the future if questions or concerns arise.  Patient does have baseline osteoporosis but has declined bisphosphonates in the past.  She will continue calcium and vitamin D   Visit Diagnosis 1. Encounter  for follow-up surveillance of breast cancer      Dr. Randa Evens, MD, MPH [facility] [phone] 07/12/2021 12:14 PM

## 2021-07-15 DIAGNOSIS — H2511 Age-related nuclear cataract, right eye: Secondary | ICD-10-CM | POA: Diagnosis not present

## 2021-07-16 DIAGNOSIS — H2512 Age-related nuclear cataract, left eye: Secondary | ICD-10-CM | POA: Diagnosis not present

## 2021-07-29 DIAGNOSIS — H52202 Unspecified astigmatism, left eye: Secondary | ICD-10-CM | POA: Diagnosis not present

## 2021-07-29 DIAGNOSIS — H2512 Age-related nuclear cataract, left eye: Secondary | ICD-10-CM | POA: Diagnosis not present

## 2021-08-06 DIAGNOSIS — Z23 Encounter for immunization: Secondary | ICD-10-CM | POA: Diagnosis not present

## 2021-09-09 DIAGNOSIS — Z20828 Contact with and (suspected) exposure to other viral communicable diseases: Secondary | ICD-10-CM | POA: Diagnosis not present

## 2021-12-20 DIAGNOSIS — Z20822 Contact with and (suspected) exposure to covid-19: Secondary | ICD-10-CM | POA: Diagnosis not present

## 2021-12-26 ENCOUNTER — Other Ambulatory Visit: Payer: Self-pay | Admitting: Internal Medicine

## 2021-12-29 ENCOUNTER — Encounter: Payer: Self-pay | Admitting: Internal Medicine

## 2021-12-29 ENCOUNTER — Ambulatory Visit (INDEPENDENT_AMBULATORY_CARE_PROVIDER_SITE_OTHER): Payer: Medicare Other | Admitting: Internal Medicine

## 2021-12-29 ENCOUNTER — Other Ambulatory Visit: Payer: Self-pay

## 2021-12-29 VITALS — BP 140/70 | HR 60 | Ht 67.0 in | Wt 147.8 lb

## 2021-12-29 DIAGNOSIS — I341 Nonrheumatic mitral (valve) prolapse: Secondary | ICD-10-CM | POA: Diagnosis not present

## 2021-12-29 DIAGNOSIS — I10 Essential (primary) hypertension: Secondary | ICD-10-CM

## 2021-12-29 DIAGNOSIS — G43009 Migraine without aura, not intractable, without status migrainosus: Secondary | ICD-10-CM | POA: Diagnosis not present

## 2021-12-29 NOTE — Assessment & Plan Note (Signed)
Stable

## 2021-12-29 NOTE — Progress Notes (Signed)
? ?Established Patient Office Visit ? ?Subjective:  ?Patient ID: Deborah Trevino, female    DOB: 09-16-1946  Age: 76 y.o. MRN: [id number] ? ?CC:  ?Chief Complaint  ?Patient presents with  ? Hypertension  ?  6 month follow up  ? ? ?Hypertension ? ? ?Deborah Trevino presents for check up ? ?Past Medical History:  ?Diagnosis Date  ? Breast cancer ([place]) 1988  ? LT MASTECTOMY  ? Breast cancer ([place]) 1989  ? RT LUMPECTOMY  ? Breast cancer ([place]) 2012  ? RT LUMPECTOMY  ? Hypertension   ? MVP (mitral valve prolapse)   ? ? ?Past Surgical History:  ?Procedure Laterality Date  ? ABDOMINAL HYSTERECTOMY    ? AUGMENTATION MAMMAPLASTY Bilateral 1989  ? BREAST LUMPECTOMY Right 1989  ? IN SITU FOUND DURING SUBCUTANEOUS MASTECTOMY  ? BREAST LUMPECTOMY Right 2012  ? BREAST RECONSTRUCTION Bilateral   ? MASTECTOMY Left 1988  ? BREAST CA  ? MASTECTOMY SUBCUTANEOUS Right 1989  ? BREAST CA FOUND  ? ? ?Family History  ?Problem Relation Age of Onset  ? Breast cancer Sister   ? Breast cancer Maternal Aunt   ? Breast cancer Paternal Grandmother   ? ? ?Social History  ? ?Socioeconomic History  ? Marital status: Married  ?  Spouse name: Not on file  ? Number of children: Not on file  ? Years of education: Not on file  ? Highest education level: Not on file  ?Occupational History  ? Not on file  ?Tobacco Use  ? Smoking status: Former  ? Smokeless tobacco: Never  ?Vaping Use  ? Vaping Use: Never used  ?Substance and Sexual Activity  ? Alcohol use: Yes  ?  Alcohol/week: 0.0 standard drinks  ?  Comment: 1 drink a month  ? Drug use: Never  ? Sexual activity: Not on file  ?Other Topics Concern  ? Not on file  ?Social History Narrative  ? Not on file  ? ?Social Determinants of Health  ? ?Financial Resource Strain: Not on file  ?Food Insecurity: Not on file  ?Transportation Needs: Not on file  ?Physical Activity: Not on file  ?Stress: Not on file  ?Social Connections: Not on file  ?Intimate Partner Violence: Not on file  ? ? ? ?Current Outpatient  Medications:  ?  ALPRAZolam (XANAX) 0.25 MG tablet, TAKE 1 TABLET BY MOUTH AT BEDTIME AS NEEDED FOR ANXIETY, Disp: 30 tablet, Rfl: 0 ?  Ascorbic Acid (VITAMIN C) 1000 MG tablet, Take 1,000 mg by mouth daily., Disp: , Rfl:  ?  aspirin 81 MG tablet, Take 81 mg by mouth 2 (two) times a week. , Disp: , Rfl:  ?  Calcium Carb-Cholecalciferol (CALCIUM-VITAMIN D) 600-400 MG-UNIT TABS, Take 1 tablet by mouth 2 (two) times daily., Disp: , Rfl:  ?  calcium carbonate (OSCAL) 1500 (600 Ca) MG TABS tablet, Take by mouth., Disp: , Rfl:  ?  Magnesium ([employer]S TRIPLE MAGNESIUM COMPLEX) 400 MG CAPS, Take 1 capsule by mouth daily., Disp: , Rfl:  ?  metoprolol tartrate (LOPRESSOR) 25 MG tablet, Take 1 tablet by mouth twice daily, Disp: 180 tablet, Rfl: 0 ?  PRED FORTE 1 % ophthalmic suspension, Place 1 drop into the right eye 4 (four) times daily., Disp: , Rfl:  ?  pyridoxine (B-6) 100 MG tablet, Take 100 mg by mouth in the morning and at bedtime., Disp: , Rfl:  ?  vitamin B-12 (CYANOCOBALAMIN) 1000 MCG tablet, Take 1,000 mcg by mouth in the morning and at  bedtime., Disp: , Rfl:   ? ?Allergies  ?Allergen Reactions  ? Adhesive [Tape] Rash  ? Latex Rash  ? ? ?ROS ?Review of Systems  ?Constitutional: Negative.   ?HENT: Negative.    ?Eyes: Negative.   ?Respiratory: Negative.    ?Cardiovascular: Negative.   ?Gastrointestinal: Negative.   ?Endocrine: Negative.   ?Genitourinary: Negative.   ?Musculoskeletal: Negative.   ?Skin: Negative.   ?Allergic/Immunologic: Negative.   ?Neurological: Negative.   ?Hematological: Negative.   ?Psychiatric/Behavioral: Negative.    ?All other systems reviewed and are negative. ? ?  ?Objective:  ?  ?Physical Exam ?Vitals reviewed.  ?Constitutional:   ?   Appearance: Normal appearance.  ?HENT:  ?   Mouth/Throat:  ?   Mouth: Mucous membranes are moist.  ?Eyes:  ?   Pupils: Pupils are equal, round, and reactive to light.  ?Neck:  ?   Vascular: No carotid bruit.  ?Cardiovascular:  ?   Rate and Rhythm: Normal rate and  regular rhythm.  ?   Pulses: Normal pulses.  ?   Heart sounds: Normal heart sounds.  ?Pulmonary:  ?   Effort: Pulmonary effort is normal.  ?   Breath sounds: Normal breath sounds.  ?Abdominal:  ?   General: Bowel sounds are normal.  ?   Palpations: Abdomen is soft. There is no hepatomegaly, splenomegaly or mass.  ?   Tenderness: There is no abdominal tenderness.  ?   Hernia: No hernia is present.  ?Musculoskeletal:     ?   General: No tenderness.  ?   Cervical back: Neck supple.  ?   Right lower leg: No edema.  ?   Left lower leg: No edema.  ?Skin: ?   Findings: No rash.  ?Neurological:  ?   Mental Status: She is alert and oriented to person, place, and time.  ?   Motor: No weakness.  ?Psychiatric:     ?   Mood and Affect: Mood and affect normal.     ?   Behavior: Behavior normal.  ? ? ?BP 140/70   Pulse 60   Ht _0  (1.702 m)   Wt 147 lb 12.8 oz (67 kg)   BMI 23.15 kg/m?  ?Wt Readings from Last 3 Encounters:  ?12/29/21 147 lb 12.8 oz (67 kg)  ?07/12/21 148 lb 1.6 oz (67.2 kg)  ?06/09/21 147 lb (66.7 kg)  ? ? ? ?Health Maintenance Due  ?Topic Date Due  ? Hepatitis C Screening  Never done  ? TETANUS/TDAP  Never done  ? Zoster Vaccines- Shingrix (1 of 2) Never done  ? Pneumonia Vaccine 72+ Years old (2 - PCV) 10/01/2013  ? COVID-19 Vaccine (5 - Booster) 05/03/2021  ? INFLUENZA VACCINE  05/24/2021  ? ? ?There are no preventive care reminders to display for this patient. ? ?Lab Results  ?Component Value Date  ? TSH 1.28 07/06/2021  ? ?Lab Results  ?Component Value Date  ? WBC 5.5 07/06/2021  ? HGB 13.1 07/06/2021  ? HCT 39.6 07/06/2021  ? MCV 90.0 07/06/2021  ? PLT 195 07/06/2021  ? ?Lab Results  ?Component Value Date  ? NA 137 07/06/2021  ? K 4.5 07/06/2021  ? CO2 25 07/06/2021  ? GLUCOSE 90 07/06/2021  ? BUN 11 07/06/2021  ? CREATININE 0.67 07/06/2021  ? BILITOT 0.6 07/06/2021  ? ALKPHOS 38 12/28/2017  ? AST 22 07/06/2021  ? ALT 16 07/06/2021  ? PROT 6.5 07/06/2021  ? ALBUMIN 4.2 12/28/2017  ? CALCIUM 9.1  07/06/2021  ? ANIONGAP 9 12/28/2017  ? EGFR 91 07/06/2021  ? ?Lab Results  ?Component Value Date  ? CHOL 229 (H) 07/06/2021  ? ?Lab Results  ?Component Value Date  ? HDL 95 07/06/2021  ? ?Lab Results  ?Component Value Date  ? LDLCALC 120 (H) 07/06/2021  ? ?Lab Results  ?Component Value Date  ? TRIG 58 07/06/2021  ? ?Lab Results  ?Component Value Date  ? CHOLHDL 2.4 07/06/2021  ? ?No results found for: HGBA1C ? ?  ?Assessment & Plan:  ? ?Problem List Items Addressed This Visit   ? ?  ? Cardiovascular and Mediastinum  ? MVP (mitral valve prolapse)  ?  Stable at the present time ?  ?  ? Migraine without aura and without status migrainosus, not intractable - Primary  ?  Stable ?  ?  ? Hypertension  ?   Patient denies any chest pain or shortness of breath there is no history of palpitation or paroxysmal nocturnal dyspnea ?  patient was advised to follow low-salt low-cholesterol diet ? ?  ideally I want to keep systolic blood pressure below 130 mmHg, patient was asked to check blood pressure one times a week and give me a report on that.  Patient will be follow-up in 3 months  or earlier as needed, patient will call me back for any change in the cardiovascular symptoms ?Patient was advised to buy a book from local bookstore concerning blood pressure and read several chapters  every day.  This will be supplemented by some of the material we will give him from the office.  Patient should also utilize other resources like YouTube and Internet to learn more about the blood pressure and the diet. ?  ?  ? ? ?No orders of the defined types were placed in this encounter. ? ? ?Follow-up: No follow-ups on file.  ? ? ?Cletis Athens, MD ?

## 2021-12-29 NOTE — Assessment & Plan Note (Signed)

## 2021-12-29 NOTE — Assessment & Plan Note (Signed)
Stable at the present time. 

## 2022-01-14 DIAGNOSIS — Z20822 Contact with and (suspected) exposure to covid-19: Secondary | ICD-10-CM | POA: Diagnosis not present

## 2022-01-17 DIAGNOSIS — Z20822 Contact with and (suspected) exposure to covid-19: Secondary | ICD-10-CM | POA: Diagnosis not present

## 2022-01-28 DIAGNOSIS — Z20822 Contact with and (suspected) exposure to covid-19: Secondary | ICD-10-CM | POA: Diagnosis not present

## 2022-02-01 DIAGNOSIS — R059 Cough, unspecified: Secondary | ICD-10-CM | POA: Diagnosis not present

## 2022-02-01 DIAGNOSIS — Z20822 Contact with and (suspected) exposure to covid-19: Secondary | ICD-10-CM | POA: Diagnosis not present

## 2022-02-01 DIAGNOSIS — R051 Acute cough: Secondary | ICD-10-CM | POA: Diagnosis not present

## 2022-02-07 DIAGNOSIS — Z20822 Contact with and (suspected) exposure to covid-19: Secondary | ICD-10-CM | POA: Diagnosis not present

## 2022-02-28 DIAGNOSIS — Z20822 Contact with and (suspected) exposure to covid-19: Secondary | ICD-10-CM | POA: Diagnosis not present

## 2022-03-22 ENCOUNTER — Other Ambulatory Visit: Payer: Self-pay | Admitting: *Deleted

## 2022-03-22 MED ORDER — METOPROLOL TARTRATE 25 MG PO TABS: 25.0000 mg | ORAL_TABLET | Freq: Two times a day (BID) | ORAL | 3 refills | Status: DC

## 2022-03-29 ENCOUNTER — Ambulatory Visit (INDEPENDENT_AMBULATORY_CARE_PROVIDER_SITE_OTHER): Payer: Medicare Other | Admitting: Internal Medicine

## 2022-03-29 ENCOUNTER — Encounter: Payer: Self-pay | Admitting: Internal Medicine

## 2022-03-29 VITALS — BP 128/80 | HR 88 | Ht 67.0 in | Wt 147.0 lb

## 2022-03-29 DIAGNOSIS — G43009 Migraine without aura, not intractable, without status migrainosus: Secondary | ICD-10-CM | POA: Diagnosis not present

## 2022-03-29 DIAGNOSIS — I10 Essential (primary) hypertension: Secondary | ICD-10-CM

## 2022-03-29 DIAGNOSIS — Z0000 Encounter for general adult medical examination without abnormal findings: Secondary | ICD-10-CM

## 2022-03-29 DIAGNOSIS — I341 Nonrheumatic mitral (valve) prolapse: Secondary | ICD-10-CM

## 2022-03-29 DIAGNOSIS — M816 Localized osteoporosis [Lequesne]: Secondary | ICD-10-CM | POA: Diagnosis not present

## 2022-03-29 DIAGNOSIS — H2513 Age-related nuclear cataract, bilateral: Secondary | ICD-10-CM

## 2022-03-29 NOTE — Assessment & Plan Note (Signed)
Patient was advised to take vitamin D every day

## 2022-03-29 NOTE — Progress Notes (Signed)
Established Patient Office Visit  Subjective:  Patient ID: Deborah Trevino, female    DOB: 10-Jun-1946  Age: 76 y.o. MRN: [id number]  CC:  Chief Complaint  Patient presents with   Hypertension    Hypertension   Deborah Trevino presents for general checkup.  She denies any chest pain or shortness of breath.  She does not smoke does not drink.  She had a bilateral breast removed.  Has a breast reconstruction surgery also she is not any tachycardia.  Past Medical History:  Diagnosis Date   Breast cancer ([place]) 1988   LT MASTECTOMY   Breast cancer ([place]) 1989   RT LUMPECTOMY   Breast cancer ([place]) 2012   RT LUMPECTOMY   Hypertension    MVP (mitral valve prolapse)     Past Surgical History:  Procedure Laterality Date   ABDOMINAL HYSTERECTOMY     AUGMENTATION MAMMAPLASTY Bilateral 1989   BREAST LUMPECTOMY Right 1989   IN SITU FOUND DURING SUBCUTANEOUS MASTECTOMY   BREAST LUMPECTOMY Right 2012   BREAST RECONSTRUCTION Bilateral    MASTECTOMY Left 1988   BREAST CA   MASTECTOMY SUBCUTANEOUS Right 1989   BREAST CA FOUND    Family History  Problem Relation Age of Onset   Breast cancer Sister    Breast cancer Maternal Aunt    Breast cancer Paternal Grandmother     Social History   Socioeconomic History   Marital status: Married    Spouse name: Not on file   Number of children: Not on file   Years of education: Not on file   Highest education level: Not on file  Occupational History   Not on file  Tobacco Use   Smoking status: Former   Smokeless tobacco: Never  Vaping Use   Vaping Use: Never used  Substance and Sexual Activity   Alcohol use: Yes    Alcohol/week: 0.0 standard drinks    Comment: 1 drink a month   Drug use: Never   Sexual activity: Not on file  Other Topics Concern   Not on file  Social History Narrative   Not on file   Social Determinants of Health   Financial Resource Strain: Not on file  Food Insecurity: Not on file   Transportation Needs: Not on file  Physical Activity: Not on file  Stress: Not on file  Social Connections: Not on file  Intimate Partner Violence: Not on file     Current Outpatient Medications:    ALPRAZolam (XANAX) 0.25 MG tablet, TAKE 1 TABLET BY MOUTH AT BEDTIME AS NEEDED FOR ANXIETY, Disp: 30 tablet, Rfl: 0   Ascorbic Acid (VITAMIN C) 1000 MG tablet, Take 1,000 mg by mouth daily., Disp: , Rfl:    aspirin 81 MG tablet, Take 81 mg by mouth 2 (two) times a week. , Disp: , Rfl:    Calcium Carb-Cholecalciferol (CALCIUM-VITAMIN D) 600-400 MG-UNIT TABS, Take 1 tablet by mouth 2 (two) times daily., Disp: , Rfl:    calcium carbonate (OSCAL) 1500 (600 Ca) MG TABS tablet, Take by mouth., Disp: , Rfl:    Magnesium ([employer]S TRIPLE MAGNESIUM COMPLEX) 400 MG CAPS, Take 1 capsule by mouth daily., Disp: , Rfl:    metoprolol tartrate (LOPRESSOR) 25 MG tablet, Take 1 tablet (25 mg total) by mouth 2 (two) times daily., Disp: 180 tablet, Rfl: 3   PRED FORTE 1 % ophthalmic suspension, Place 1 drop into the right eye 4 (four) times daily., Disp: , Rfl:    pyridoxine (B-6) 100  MG tablet, Take 100 mg by mouth in the morning and at bedtime., Disp: , Rfl:    vitamin B-12 (CYANOCOBALAMIN) 1000 MCG tablet, Take 1,000 mcg by mouth in the morning and at bedtime., Disp: , Rfl:    Allergies  Allergen Reactions   Adhesive [Tape] Rash   Latex Rash    ROS Review of Systems  Constitutional: Negative.   HENT: Negative.    Eyes: Negative.   Respiratory: Negative.    Cardiovascular: Negative.   Gastrointestinal: Negative.   Endocrine: Negative.   Genitourinary: Negative.   Musculoskeletal: Negative.   Skin: Negative.   Allergic/Immunologic: Negative.   Neurological: Negative.   Hematological: Negative.   Psychiatric/Behavioral: Negative.    All other systems reviewed and are negative.    Objective:    Physical Exam Vitals reviewed.  Constitutional:      Appearance: Normal appearance.  HENT:      Mouth/Throat:     Mouth: Mucous membranes are moist.  Eyes:     Pupils: Pupils are equal, round, and reactive to light.  Neck:     Vascular: No carotid bruit.  Cardiovascular:     Rate and Rhythm: Normal rate and regular rhythm.     Pulses: Normal pulses.     Heart sounds: Normal heart sounds.  Pulmonary:     Effort: Pulmonary effort is normal.     Breath sounds: Normal breath sounds.  Abdominal:     General: Bowel sounds are normal.     Palpations: Abdomen is soft. There is no hepatomegaly, splenomegaly or mass.     Tenderness: There is no abdominal tenderness.     Hernia: No hernia is present.  Musculoskeletal:        General: No tenderness.     Cervical back: Neck supple.     Right lower leg: No edema.     Left lower leg: No edema.  Skin:    Findings: No rash.  Neurological:     Mental Status: She is alert and oriented to person, place, and time.     Motor: No weakness.  Psychiatric:        Mood and Affect: Mood and affect normal.        Behavior: Behavior normal.    BP 128/80   Pulse 88   Ht 5' 7"  (1.702 m)   Wt 147 lb (66.7 kg)   BMI 23.02 kg/m  Wt Readings from Last 3 Encounters:  03/29/22 147 lb (66.7 kg)  12/29/21 147 lb 12.8 oz (67 kg)  07/12/21 148 lb 1.6 oz (67.2 kg)     Health Maintenance Due  Topic Date Due   Hepatitis C Screening  Never done   TETANUS/TDAP  Never done   Zoster Vaccines- Shingrix (1 of 2) Never done   Pneumonia Vaccine 11+ Years old (2 - PCV) 10/01/2013   COVID-19 Vaccine (5 - Booster) 05/03/2021    There are no preventive care reminders to display for this patient.  Lab Results  Component Value Date   TSH 1.28 07/06/2021   Lab Results  Component Value Date   WBC 5.5 07/06/2021   HGB 13.1 07/06/2021   HCT 39.6 07/06/2021   MCV 90.0 07/06/2021   PLT 195 07/06/2021   Lab Results  Component Value Date   NA 137 07/06/2021   K 4.5 07/06/2021   CO2 25 07/06/2021   GLUCOSE 90 07/06/2021   BUN 11 07/06/2021   CREATININE  0.67 07/06/2021   BILITOT 0.6 07/06/2021  ALKPHOS 38 12/28/2017   AST 22 07/06/2021   ALT 16 07/06/2021   PROT 6.5 07/06/2021   ALBUMIN 4.2 12/28/2017   CALCIUM 9.1 07/06/2021   ANIONGAP 9 12/28/2017   EGFR 91 07/06/2021   Lab Results  Component Value Date   CHOL 229 (H) 07/06/2021   Lab Results  Component Value Date   HDL 95 07/06/2021   Lab Results  Component Value Date   LDLCALC 120 (H) 07/06/2021   Lab Results  Component Value Date   TRIG 58 07/06/2021   Lab Results  Component Value Date   CHOLHDL 2.4 07/06/2021   No results found for: HGBA1C    Assessment & Plan:   Problem List Items Addressed This Visit       Cardiovascular and Mediastinum   MVP (mitral valve prolapse)    Denies any tachycardia or dizziness or passing out spell.       Migraine without aura and without status migrainosus, not intractable    Stable at the present time.  Patient follow low tyramine diet       Hypertension - Primary    The following hypertensive lifestyle modification were recommended and discussed:  1. Limiting alcohol intake to less than 1 oz/day of ethanol:(24 oz of beer or 8 oz of wine or 2 oz of 100-proof whiskey). 2. Take baby ASA 81 mg daily. 3. Importance of regular aerobic exercise and losing weight. 4. Reduce dietary saturated fat and cholesterol intake for overall cardiovascular health. 5. Maintaining adequate dietary potassium, calcium, and magnesium intake. 6. Regular monitoring of the blood pressure. 7. Reduce sodium intake to less than 100 mmol/day (less than 2.3 gm of sodium or less than 6 gm of sodium choride)          Musculoskeletal and Integument   Localized osteoporosis without current pathological fracture    Patient was advised to take vitamin D every day         Other   Age-related nuclear cataract of both eyes    Stable at the present time patient has cataract surgery       Encounter for preventive care    Patient declines  shingles shot and flu shot        No orders of the defined types were placed in this encounter.   Follow-up: No follow-ups on file.    Cletis Athens, MD

## 2022-03-29 NOTE — Assessment & Plan Note (Signed)
Stable at the present time.  Patient follow low tyramine diet

## 2022-03-29 NOTE — Assessment & Plan Note (Signed)
Stable at the present time patient has cataract surgery

## 2022-03-29 NOTE — Assessment & Plan Note (Signed)
Denies any tachycardia or dizziness or passing out spell.

## 2022-03-29 NOTE — Assessment & Plan Note (Signed)
Patient declines shingles shot and flu shot

## 2022-03-29 NOTE — Assessment & Plan Note (Signed)

## 2022-05-31 DIAGNOSIS — Z961 Presence of intraocular lens: Secondary | ICD-10-CM | POA: Diagnosis not present

## 2022-07-13 DIAGNOSIS — Z23 Encounter for immunization: Secondary | ICD-10-CM | POA: Diagnosis not present

## 2023-03-10 DIAGNOSIS — M8440XA Pathological fracture, unspecified site, initial encounter for fracture: Secondary | ICD-10-CM | POA: Diagnosis not present

## 2023-03-10 DIAGNOSIS — G43909 Migraine, unspecified, not intractable, without status migrainosus: Secondary | ICD-10-CM | POA: Diagnosis not present

## 2023-03-10 DIAGNOSIS — I10 Essential (primary) hypertension: Secondary | ICD-10-CM | POA: Diagnosis not present

## 2023-05-08 DIAGNOSIS — E119 Type 2 diabetes mellitus without complications: Secondary | ICD-10-CM | POA: Diagnosis not present

## 2023-05-08 DIAGNOSIS — E7849 Other hyperlipidemia: Secondary | ICD-10-CM | POA: Diagnosis not present

## 2023-05-08 LAB — LIPID PANEL
Cholesterol: 259 — AB (ref 0–200)
HDL: 108 — AB (ref 35–70)
LDL Cholesterol: 135
Triglycerides: 67 (ref 40–160)

## 2023-05-08 LAB — HEPATIC FUNCTION PANEL
ALT: 11 U/L (ref 7–35)
AST: 14 (ref 13–35)
Alkaline Phosphatase: 56 (ref 25–125)
Bilirubin, Total: 0.6

## 2023-05-08 LAB — COMPREHENSIVE METABOLIC PANEL WITH GFR
Albumin: 4.5 (ref 3.5–5.0)
Calcium: 9.5 (ref 8.7–10.7)
Globulin: 2.4
eGFR: 91

## 2023-05-08 LAB — BASIC METABOLIC PANEL WITH GFR
BUN: 15 (ref 4–21)
CO2: 27 — AB (ref 13–22)
Chloride: 102 (ref 99–108)
Creatinine: 0.6 (ref 0.5–1.1)
Glucose: 86
Potassium: 4.4 meq/L (ref 3.5–5.1)
Sodium: 140 (ref 137–147)

## 2023-05-08 LAB — TSH: TSH: 1.9 (ref 0.41–5.90)

## 2023-05-08 LAB — CBC AND DIFFERENTIAL
HCT: 42 (ref 36–46)
Hemoglobin: 13 (ref 12.0–16.0)
Neutrophils Absolute: 3310
Platelets: 156 10*3/uL (ref 150–400)
WBC: 5.1

## 2023-05-08 LAB — CBC: RBC: 4.43 (ref 3.87–5.11)

## 2023-06-16 DIAGNOSIS — I341 Nonrheumatic mitral (valve) prolapse: Secondary | ICD-10-CM | POA: Diagnosis not present

## 2023-06-16 DIAGNOSIS — G43909 Migraine, unspecified, not intractable, without status migrainosus: Secondary | ICD-10-CM | POA: Diagnosis not present

## 2023-06-16 DIAGNOSIS — I10 Essential (primary) hypertension: Secondary | ICD-10-CM | POA: Diagnosis not present

## 2023-07-28 DIAGNOSIS — Z23 Encounter for immunization: Secondary | ICD-10-CM | POA: Diagnosis not present

## 2023-10-28 LAB — COMPREHENSIVE METABOLIC PANEL WITH GFR
Albumin: 4.5 (ref 3.5–5.0)
Calcium: 9.1 (ref 8.7–10.7)
Globulin: 2
eGFR: 22

## 2023-10-28 LAB — BASIC METABOLIC PANEL WITH GFR
BUN: 13 (ref 4–21)
CO2: 24 — AB (ref 13–22)
Chloride: 106 (ref 99–108)
Creatinine: 0.6 (ref 0.5–1.1)
Glucose: 92
Potassium: 4.8 meq/L (ref 3.5–5.1)
Sodium: 139 (ref 137–147)

## 2023-10-28 LAB — HEPATIC FUNCTION PANEL
ALT: 15 U/L (ref 7–35)
AST: 20 (ref 13–35)
Alkaline Phosphatase: 52 (ref 25–125)
Bilirubin, Total: 0.5

## 2023-10-28 LAB — CBC AND DIFFERENTIAL
HCT: 38 (ref 36–46)
Hemoglobin: 12.5 (ref 12.0–16.0)
Neutrophils Absolute: 3407
Platelets: 208 10*3/uL (ref 150–400)
WBC: 5.4

## 2023-10-28 LAB — CBC: RBC: 4.31 (ref 3.87–5.11)

## 2024-04-03 ENCOUNTER — Ambulatory Visit (INDEPENDENT_AMBULATORY_CARE_PROVIDER_SITE_OTHER): Admitting: Nurse Practitioner

## 2024-04-03 ENCOUNTER — Encounter: Payer: Self-pay | Admitting: Nurse Practitioner

## 2024-04-03 VITALS — BP 126/74 | HR 83 | Resp 16 | Ht 67.0 in | Wt 150.0 lb

## 2024-04-03 DIAGNOSIS — M816 Localized osteoporosis [Lequesne]: Secondary | ICD-10-CM | POA: Diagnosis not present

## 2024-04-03 DIAGNOSIS — Z170 Estrogen receptor positive status [ER+]: Secondary | ICD-10-CM

## 2024-04-03 DIAGNOSIS — I341 Nonrheumatic mitral (valve) prolapse: Secondary | ICD-10-CM

## 2024-04-03 DIAGNOSIS — Z7689 Persons encountering health services in other specified circumstances: Secondary | ICD-10-CM

## 2024-04-03 DIAGNOSIS — I10 Essential (primary) hypertension: Secondary | ICD-10-CM

## 2024-04-03 DIAGNOSIS — C50912 Malignant neoplasm of unspecified site of left female breast: Secondary | ICD-10-CM

## 2024-04-03 DIAGNOSIS — Z131 Encounter for screening for diabetes mellitus: Secondary | ICD-10-CM

## 2024-04-03 DIAGNOSIS — H2513 Age-related nuclear cataract, bilateral: Secondary | ICD-10-CM

## 2024-04-03 DIAGNOSIS — G43009 Migraine without aura, not intractable, without status migrainosus: Secondary | ICD-10-CM | POA: Diagnosis not present

## 2024-04-03 DIAGNOSIS — Z1159 Encounter for screening for other viral diseases: Secondary | ICD-10-CM

## 2024-04-03 DIAGNOSIS — E782 Mixed hyperlipidemia: Secondary | ICD-10-CM | POA: Insufficient documentation

## 2024-04-03 NOTE — Progress Notes (Signed)
 BP 126/74   Pulse 83   Resp 16   Ht 5' 7 (1.702 m)   Wt 150 lb (68 kg)   SpO2 92%   BMI 23.49 kg/m    Subjective:    Patient ID: Deborah Trevino, female    DOB: 01/01/1946, 78 y.o.   MRN: [id number]  HPI: Deborah Trevino is a 79 y.o. female  Chief Complaint  Patient presents with   Establish Care    Discussed the use of AI scribe software for clinical note transcription with the patient, who gave verbal consent to proceed.  History of Present Illness Deborah Trevino is a 78 year old female who presents to establish care.  Deborah Trevino has a history of migraines, which are well-controlled with a regimen of triple magnesium, B6, and B12. Deborah Trevino experiences an aura sometimes but finds relief by resting in a dark room for 15-20 minutes.  Her hypertension is managed with metoprolol  25 mg twice daily, and Deborah Trevino reports no issues with this medication.  Regarding her non-rheumatic mitral valve prolapse, Deborah Trevino occasionally feels her heart 'skip a beat' and uses Xanax  0.25 mg as needed for anxiety related to this sensation. A 30-day prescription of Xanax  lasts her six to nine months.  Her osteoporosis is attributed to past radiation and chemotherapy for breast cancer. Deborah Trevino opts for vitamin D, calcium, water aerobics, and walking as her management strategy.  Deborah Trevino has a history of breast cancer, first diagnosed in 1988, with the last occurrence in 2012. Deborah Trevino has implants that have been uncomfortable since the surgery, particularly after intense physical activity.  Deborah Trevino started rosuvastatin 10 mg daily in January for hyperlipidemia, but has not had her lipid levels checked in almost a year. Deborah Trevino reports bilateral leg swelling that began a couple of months after starting the medication. No shortness of breath or pain associated with the swelling.  Her current medications include Xanax  0.25 mg as needed, calcium and vitamin D, metoprolol  25 mg twice daily, rosuvastatin 10 mg daily, vitamin B6 100 mg  twice daily, B12, vitamin C, and magnesium.         04/03/2024    3:14 PM 12/29/2021   10:03 AM 06/09/2021   10:07 AM  Depression screen PHQ 2/9  Decreased Interest 0 0 0  Down, Depressed, Hopeless 0 0 0  PHQ - 2 Score 0 0 0    Relevant past medical, surgical, family and social history reviewed and updated as indicated. Interim medical history since our last visit reviewed. Allergies and medications reviewed and updated.  Review of Systems  Constitutional: Negative for fever or weight change.  Respiratory: Negative for cough and shortness of breath.   Cardiovascular: Negative for chest pain or palpitations.  Gastrointestinal: Negative for abdominal pain, no bowel changes.  Musculoskeletal: Negative for gait problem or joint swelling.  Skin: Negative for rash.  Neurological: Negative for dizziness or headache.  No other specific complaints in a complete review of systems (except as listed in HPI above).      Objective:      BP 126/74   Pulse 83   Resp 16   Ht 5' 7 (1.702 m)   Wt 150 lb (68 kg)   SpO2 92%   BMI 23.49 kg/m    Wt Readings from Last 3 Encounters:  04/03/24 150 lb (68 kg)  03/29/22 147 lb (66.7 kg)  12/29/21 147 lb 12.8 oz (67 kg)    Physical Exam Vitals reviewed.  Constitutional:  Appearance: Normal appearance.  HENT:     Head: Normocephalic.  Cardiovascular:     Rate and Rhythm: Normal rate and regular rhythm.  Pulmonary:     Effort: Pulmonary effort is normal.     Breath sounds: Normal breath sounds.  Musculoskeletal:        General: Normal range of motion.  Skin:    General: Skin is warm and dry.  Neurological:     General: No focal deficit present.     Mental Status: Deborah Trevino is alert and oriented to person, place, and time. Mental status is at baseline.  Psychiatric:        Mood and Affect: Mood normal.        Behavior: Behavior normal.        Thought Content: Thought content normal.        Judgment: Judgment normal.        Results for orders placed or performed in visit on 07/06/21  CBC with Differential/Platelet   Collection Time: 07/06/21 10:18 AM  Result Value Ref Range   WBC 5.5 3.8 - 10.8 Thousand/uL   RBC 4.40 3.80 - 5.10 Million/uL   Hemoglobin 13.1 11.7 - 15.5 g/dL   HCT 98.1 19.1 - 47.8 %   MCV 90.0 80.0 - 100.0 fL   MCH 29.8 27.0 - 33.0 pg   MCHC 33.1 32.0 - 36.0 g/dL   RDW 29.5 62.1 - 30.8 %   Platelets 195 140 - 400 Thousand/uL   MPV 11.6 7.5 - 12.5 fL   Neutro Abs 3,735 1,500 - 7,800 cells/uL   Lymphs Abs 968 850 - 3,900 cells/uL   Absolute Monocytes 402 200 - 950 cells/uL   Eosinophils Absolute 363 15 - 500 cells/uL   Basophils Absolute 33 0 - 200 cells/uL   Neutrophils Relative % 67.9 %   Total Lymphocyte 17.6 %   Monocytes Relative 7.3 %   Eosinophils Relative 6.6 %   Basophils Relative 0.6 %  COMPLETE METABOLIC PANEL WITH GFR   Collection Time: 07/06/21 10:18 AM  Result Value Ref Range   Glucose, Bld 90 65 - 99 mg/dL   BUN 11 7 - 25 mg/dL   Creat 6.57 8.46 - 9.62 mg/dL   eGFR 91 > OR = 60 XB/MWU/1.32G4   BUN/Creatinine Ratio NOT APPLICABLE 6 - 22 (calc)   Sodium 137 135 - 146 mmol/L   Potassium 4.5 3.5 - 5.3 mmol/L   Chloride 101 98 - 110 mmol/L   CO2 25 20 - 32 mmol/L   Calcium 9.1 8.6 - 10.4 mg/dL   Total Protein 6.5 6.1 - 8.1 g/dL   Albumin 4.4 3.6 - 5.1 g/dL   Globulin 2.1 1.9 - 3.7 g/dL (calc)   AG Ratio 2.1 1.0 - 2.5 (calc)   Total Bilirubin 0.6 0.2 - 1.2 mg/dL   Alkaline phosphatase (APISO) 55 37 - 153 U/L   AST 22 10 - 35 U/L   ALT 16 6 - 29 U/L  TSH   Collection Time: 07/06/21 10:18 AM  Result Value Ref Range   TSH 1.28 0.40 - 4.50 mIU/L  Lipid panel   Collection Time: 07/06/21 10:18 AM  Result Value Ref Range   Cholesterol 229 (H) <200 mg/dL   HDL 95 > OR = 50 mg/dL   Triglycerides 58 <010 mg/dL   LDL Cholesterol (Calc) 120 (H) mg/dL (calc)   Total CHOL/HDL Ratio 2.4 <5.0 (calc)   Non-HDL Cholesterol (Calc) 134 (H) <130 mg/dL (calc)  Assessment & Plan:   Problem List Items Addressed This Visit       Cardiovascular and Mediastinum   MVP (mitral valve prolapse)   Relevant Medications   rosuvastatin (CRESTOR) 10 MG tablet   Migraine without aura and without status migrainosus, not intractable   Relevant Medications   rosuvastatin (CRESTOR) 10 MG tablet   Hypertension - Primary   Relevant Medications   rosuvastatin (CRESTOR) 10 MG tablet   Other Relevant Orders   CBC with Differential/Platelet   Comprehensive metabolic panel with GFR     Musculoskeletal and Integument   Localized osteoporosis without current pathological fracture     Other   Breast cancer (HCC)   Age-related nuclear cataract of both eyes   Mixed hyperlipidemia   Relevant Medications   rosuvastatin (CRESTOR) 10 MG tablet   Other Relevant Orders   Lipid panel   Other Visit Diagnoses       Encounter for hepatitis C screening test for low risk patient       Relevant Orders   Hepatitis C antibody     Encounter to establish care         Screening for diabetes mellitus       Relevant Orders   Comprehensive metabolic panel with GFR   Hemoglobin A1c        Assessment and Plan Assessment & Plan Nonrheumatic mitral valve prolapse Experiences palpitations and perceives skipped heartbeats. Xanax  0.25 mg is used as needed for anxiety related to palpitations.  Hypertension Blood pressure is well-controlled with metoprolol  25 mg twice daily.  Hyperlipidemia On rosuvastatin 10 mg daily since January. Reports bilateral leg swelling, which started a few months after initiation. No associated dyspnea or pain. - Order lipid panel to assess current cholesterol levels. - Consider trial of discontinuing rosuvastatin if cholesterol levels are stable to evaluate if swelling improves. - Consider reducing rosuvastatin dose to 5 mg if needed.  Osteoporosis Osteoporosis secondary to previous breast cancer treatment. Deborah Trevino has chosen not to take  bisphosphonates due to concerns about potential osteonecrosis of the jaw, informed by a personal anecdote, and has opted for a natural approach with vitamin D, calcium, water aerobics, and walking.  Breast cancer Breast cancer treatment completed in 2012. No breast tissue remains, so no further screening is conducted. Experiences discomfort from implants, especially after intense physical activity.  Anxiety Anxiety managed with Xanax  0.25 mg as needed. Prescription lasts 6-9 months, indicating infrequent use.  Migraine Migraines are well-controlled with a regimen of triple magnesium, B6, and B12. Occasional aura managed by resting in a dark room for 15-20 minutes.    Lower extremity edema- reports started after starting rosuvastatin. Denies any pain, will check lipid panel and weight out stopping medication.     Follow up plan: Return in about 6 months (around 10/03/2024) for follow up.

## 2024-04-04 LAB — CBC WITH DIFFERENTIAL/PLATELET
Absolute Lymphocytes: 1485 {cells}/uL (ref 850–3900)
Absolute Monocytes: 495 {cells}/uL (ref 200–950)
Basophils Absolute: 38 {cells}/uL (ref 0–200)
Basophils Relative: 0.5 %
Eosinophils Absolute: 413 {cells}/uL (ref 15–500)
Eosinophils Relative: 5.5 %
HCT: 39.3 % (ref 35.0–45.0)
Hemoglobin: 12.9 g/dL (ref 11.7–15.5)
MCH: 29.4 pg (ref 27.0–33.0)
MCHC: 32.8 g/dL (ref 32.0–36.0)
MCV: 89.5 fL (ref 80.0–100.0)
MPV: 10.7 fL (ref 7.5–12.5)
Monocytes Relative: 6.6 %
Neutro Abs: 5070 {cells}/uL (ref 1500–7800)
Neutrophils Relative %: 67.6 %
Platelets: 204 10*3/uL (ref 140–400)
RBC: 4.39 10*6/uL (ref 3.80–5.10)
RDW: 12.5 % (ref 11.0–15.0)
Total Lymphocyte: 19.8 %
WBC: 7.5 10*3/uL (ref 3.8–10.8)

## 2024-04-04 LAB — COMPREHENSIVE METABOLIC PANEL WITH GFR
AG Ratio: 2 (calc) (ref 1.0–2.5)
ALT: 17 U/L (ref 6–29)
AST: 20 U/L (ref 10–35)
Albumin: 4.7 g/dL (ref 3.6–5.1)
Alkaline phosphatase (APISO): 55 U/L (ref 37–153)
BUN/Creatinine Ratio: 28 (calc) — ABNORMAL HIGH (ref 6–22)
BUN: 16 mg/dL (ref 7–25)
CO2: 27 mmol/L (ref 20–32)
Calcium: 10.1 mg/dL (ref 8.6–10.4)
Chloride: 99 mmol/L (ref 98–110)
Creat: 0.57 mg/dL — ABNORMAL LOW (ref 0.60–1.00)
Globulin: 2.3 g/dL (ref 1.9–3.7)
Glucose, Bld: 83 mg/dL (ref 65–99)
Potassium: 4.8 mmol/L (ref 3.5–5.3)
Sodium: 140 mmol/L (ref 135–146)
Total Bilirubin: 0.6 mg/dL (ref 0.2–1.2)
Total Protein: 7 g/dL (ref 6.1–8.1)
eGFR: 93 mL/min/{1.73_m2} (ref >=60)

## 2024-04-04 LAB — LIPID PANEL
Cholesterol: 199 mg/dL (ref <200)
HDL: 93 mg/dL (ref >=50)
LDL Cholesterol (Calc): 91 mg/dL
Non-HDL Cholesterol (Calc): 106 mg/dL (ref <130)
Total CHOL/HDL Ratio: 2.1 (calc) (ref <5.0)
Triglycerides: 63 mg/dL (ref <150)

## 2024-04-04 LAB — HEMOGLOBIN A1C
Hgb A1c MFr Bld: 5.5 % (ref <5.7)
Mean Plasma Glucose: 111 mg/dL
eAG (mmol/L): 6.2 mmol/L

## 2024-04-04 LAB — HEPATITIS C ANTIBODY: Hepatitis C Ab: NONREACTIVE

## 2024-04-05 ENCOUNTER — Ambulatory Visit: Payer: Self-pay | Admitting: Nurse Practitioner

## 2024-05-29 ENCOUNTER — Telehealth: Payer: Self-pay | Admitting: Nurse Practitioner

## 2024-05-29 MED ORDER — ROSUVASTATIN CALCIUM 10 MG PO TABS: 10.0000 mg | ORAL_TABLET | Freq: Every day | ORAL | 0 refills | Status: DC

## 2024-05-29 NOTE — Telephone Encounter (Signed)
 Crestor  10mg   quantity 90

## 2024-09-04 ENCOUNTER — Other Ambulatory Visit: Payer: Self-pay | Admitting: Nurse Practitioner

## 2024-09-06 NOTE — Telephone Encounter (Signed)
 Requested Prescriptions  Pending Prescriptions Disp Refills   rosuvastatin  (CRESTOR ) 10 MG tablet [Pharmacy Med Name: Rosuvastatin  Calcium  10 MG Oral Tablet] 90 tablet 1    Sig: Take 1 tablet by mouth once daily     Cardiovascular:  Antilipid - Statins 2 Failed - 09/06/2024 12:32 PM      Failed - Cr in normal range and within 360 days    Creat  Date Value Ref Range Status  04/03/2024 0.57 (L) 0.60 - 1.00 mg/dL Final         Failed - Lipid Panel in normal range within the last 12 months    Cholesterol  Date Value Ref Range Status  04/03/2024 199 <200 mg/dL Final  96/81/7985 755 (H) 0 - 200 mg/dL Final   Ldl Cholesterol, Calc  Date Value Ref Range Status  01/08/2013 148 (H) 0 - 100 mg/dL Final   LDL Cholesterol (Calc)  Date Value Ref Range Status  04/03/2024 91 mg/dL (calc) Final    Comment:    Reference range: <100 . Desirable range <100 mg/dL for primary prevention;   <70 mg/dL for patients with CHD or diabetic patients  with > or = 2 CHD risk factors. SABRA LDL-C is now calculated using the Martin-Hopkins  calculation, which is a validated novel method providing  better accuracy than the Friedewald equation in the  estimation of LDL-C.  Gladis APPLETHWAITE et al. SANDREA. 7986;689(80): 2061-2068  (http://education.[facility].com/faq/FAQ164)    HDL Cholesterol  Date Value Ref Range Status  01/08/2013 82 (H) 40 - 60 mg/dL Final   HDL  Date Value Ref Range Status  04/03/2024 93 > OR = 50 mg/dL Final   Triglycerides  Date Value Ref Range Status  04/03/2024 63 <150 mg/dL Final  96/81/7985 72 0 - 200 mg/dL Final         Passed - Patient is not pregnant      Passed - Valid encounter within last 12 months    Recent Outpatient Visits           5 months ago Primary hypertension   [facility] Gareth Mliss FALCON, FNP       Future Appointments             In 1 month Gareth, Mliss FALCON, FNP [facility], [place]

## 2024-10-01 ENCOUNTER — Ambulatory Visit

## 2024-10-01 VITALS — Ht 67.0 in | Wt 153.8 lb

## 2024-10-01 DIAGNOSIS — Z0000 Encounter for general adult medical examination without abnormal findings: Secondary | ICD-10-CM

## 2024-10-01 NOTE — Patient Instructions (Addendum)
 Deborah Trevino,  Thank you for taking the time for your Medicare Wellness Visit. I appreciate your continued commitment to your health goals. Please review the care plan we discussed, and feel free to reach out if I can assist you further.  Please note that Annual Wellness Visits do not include a physical exam. Some assessments may be limited, especially if the visit was conducted virtually. If needed, we may recommend an in-person follow-up with your provider.  Ongoing Care Seeing your primary care provider every 3 to 6 months helps us  monitor your health and provide consistent, personalized care. Next office visit on 10/15/2024.  You are due for a tetanus vaccine and can get that done at your pharmacy.  You are also due for a pneumonia vaccine.  Please discuss with your provider during your next office visit.    Referrals If a referral was made during today's visit and you haven't received any updates within two weeks, please contact the referred provider directly to check on the status.  Recommended Screenings:  Health Maintenance  Topic Date Due   DTaP/Tdap/Td vaccine (1 - Tdap) Never done   Zoster (Shingles) Vaccine (1 of 2) Never done   Pneumococcal Vaccine for age over 74 (2 of 2 - PCV) 10/01/2013   Breast Cancer Screening  05/15/2019   Medicare Annual Wellness Visit  06/02/2019   Flu Shot  05/24/2024   COVID-19 Vaccine (5 - 2025-26 season) 06/24/2024   Osteoporosis screening with Bone Density Scan  Completed   Hepatitis C Screening  Completed   Meningitis B Vaccine  Aged Out       10/01/2024    2:39 PM  Advanced Directives  Does Patient Have a Medical Advance Directive? Yes  Type of Estate Agent of [place];Living will  Copy of Healthcare Power of Attorney in Chart? No - copy requested    Vision: Annual vision screenings are recommended for early detection of glaucoma, cataracts, and diabetic retinopathy. These exams can also reveal signs of chronic  conditions such as diabetes and high blood pressure.  Dental: Annual dental screenings help detect early signs of oral cancer, gum disease, and other conditions linked to overall health, including heart disease and diabetes.  Please see the attached documents for additional preventive care recommendations.

## 2024-10-01 NOTE — Progress Notes (Signed)
 Chief Complaint  Patient presents with   Medicare Wellness     Subjective:   Deborah Trevino is a 78 y.o. female who presents for a Medicare Annual Wellness Visit.  Visit info / Clinical Intake: Medicare Wellness Visit Type:: Subsequent Annual Wellness Visit Persons participating in visit and providing information:: patient Medicare Wellness Visit Mode:: Video Since this visit was completed virtually, some vitals may be partially provided or unavailable. Missing vitals are due to the limitations of the virtual format.: Documented vitals are patient reported If Telephone or Video please confirm:: I connected with patient using audio/video enable telemedicine. I verified patient identity with two identifiers, discussed telehealth limitations, and patient agreed to proceed. Patient Location:: Home Provider Location:: Home Interpreter Needed?: No Pre-visit prep was completed: yes AWV questionnaire completed by patient prior to visit?: no Living arrangements:: lives with spouse/significant other Patient's Overall Health Status Rating: good Typical amount of pain: none Does pain affect daily life?: no Are you currently prescribed opioids?: no  Dietary Habits and Nutritional Risks How many meals a day?: 3 Eats fruit and vegetables daily?: yes Most meals are obtained by: preparing own meals In the last 2 weeks, have you had any of the following?: none Diabetic:: no  Functional Status Activities of Daily Living (to include ambulation/medication): Independent Ambulation: Independent Medication Administration: Independent Home Management (perform basic housework or laundry): Independent Manage your own finances?: yes Primary transportation is: driving Concerns about vision?: no *vision screening is required for WTM* Concerns about hearing?: (!) yes Uses hearing aids?: (!) yes Hear whispered voice?: (!) no *in-person visit only*  Fall Screening Falls in the past year?:  0 Number of falls in past year: 0 Was there an injury with Fall?: 0 Fall Risk Category Calculator: 0 Patient Fall Risk Level: Low Fall Risk  Fall Risk Patient at Risk for Falls Due to: No Fall Risks Fall risk Follow up: Falls evaluation completed; Falls prevention discussed  Home and Transportation Safety: All rugs have non-skid backing?: yes All stairs or steps have railings?: yes Grab bars in the bathtub or shower?: (!) no Have non-skid surface in bathtub or shower?: yes Good home lighting?: yes Regular seat belt use?: yes Hospital stays in the last year:: no  Cognitive Assessment Difficulty concentrating, remembering, or making decisions? : no Will 6CIT or Mini Cog be Completed: yes What year is it?: 0 points What month is it?: 0 points Give patient an address phrase to remember (5 components): [address], [place], [place] About what time is it?: 0 points Count backwards from 20 to 1: 0 points Say the months of the year in reverse: 0 points Repeat the address phrase from earlier: 0 points 6 CIT Score: 0 points  Advance Directives (For Healthcare) Does Patient Have a Medical Advance Directive?: Yes Type of Advance Directive: Healthcare Power of [place]; Living will Copy of Healthcare Power of Attorney in Chart?: No - copy requested Copy of Living Will in Chart?: No - copy requested  Reviewed/Updated  Reviewed/Updated: Reviewed All (Medical, Surgical, Family, Medications, Allergies, Care Teams, Patient Goals)    Allergies (verified) Adhesive [tape] and Latex   Current Medications (verified) Outpatient Encounter Medications as of 10/01/2024  Medication Sig   ALPRAZolam  (XANAX ) 0.25 MG tablet TAKE 1 TABLET BY MOUTH AT BEDTIME AS NEEDED FOR ANXIETY   Ascorbic Acid (VITAMIN C) 1000 MG tablet Take 1,000 mg by mouth daily.   aspirin 81 MG tablet Take 81 mg by mouth 2 (two) times a  week.    Calcium  Carb-Cholecalciferol (CALCIUM -VITAMIN D) 600-400 MG-UNIT TABS Take 1 tablet  by mouth 2 (two) times daily.   Magnesium ([employer] TRIPLE MAGNESIUM COMPLEX) 400 MG CAPS Take 1 capsule by mouth daily.   metoprolol  tartrate (LOPRESSOR ) 25 MG tablet Take 1 tablet (25 mg total) by mouth 2 (two) times daily.   pyridoxine (B-6) 100 MG tablet Take 100 mg by mouth in the morning and at bedtime.   rosuvastatin  (CRESTOR ) 10 MG tablet Take 1 tablet by mouth once daily (Patient taking differently: Take 10 mg by mouth daily. Patient taking 1/2 of the 10 mg daily)   vitamin B-12 (CYANOCOBALAMIN) 1000 MCG tablet Take 1,000 mcg by mouth in the morning and at bedtime.   No facility-administered encounter medications on file as of 10/01/2024.    History: Past Medical History:  Diagnosis Date   Breast cancer (HCC) 1988   LT MASTECTOMY   Breast cancer (HCC) 1989   RT LUMPECTOMY   Breast cancer (HCC) 2012   RT LUMPECTOMY   Hypertension    MVP (mitral valve prolapse)    Past Surgical History:  Procedure Laterality Date   ABDOMINAL HYSTERECTOMY     AUGMENTATION MAMMAPLASTY Bilateral 1989   BREAST LUMPECTOMY Right 1989   IN SITU FOUND DURING SUBCUTANEOUS MASTECTOMY   BREAST LUMPECTOMY Right 2012   BREAST RECONSTRUCTION Bilateral    MASTECTOMY Left 1988   BREAST CA   MASTECTOMY SUBCUTANEOUS Right 1989   BREAST CA FOUND   Family History  Problem Relation Age of Onset   Cancer Mother    Cancer Father    Breast cancer Sister    Breast cancer Maternal Aunt    Breast cancer Paternal Grandmother    Social History   Occupational History   Occupation: RETIRED  Tobacco Use   Smoking status: Former    Types: Cigarettes   Smokeless tobacco: Never  Vaping Use   Vaping status: Never Used  Substance and Sexual Activity   Alcohol use: Yes    Comment: 1 drink a year   Drug use: Never   Sexual activity: Not Currently   Tobacco Counseling Counseling given: Not Answered  SDOH Screenings   Food Insecurity: No Food Insecurity (10/01/2024)  Housing: Unknown (10/01/2024)   Transportation Needs: No Transportation Needs (10/01/2024)  Utilities: Not At Risk (10/01/2024)  Alcohol Screen: Low Risk  (04/03/2024)  Depression (PHQ2-9): Low Risk  (10/01/2024)  Financial Resource Strain: Low Risk  (04/03/2024)  Recent Concern: Financial Resource Strain - High Risk (04/03/2024)  Physical Activity: Insufficiently Active (10/01/2024)  Social Connections: Socially Integrated (10/01/2024)  Stress: No Stress Concern Present (10/01/2024)  Tobacco Use: Medium Risk (10/01/2024)  Health Literacy: Adequate Health Literacy (10/01/2024)   See flowsheets for full screening details  Depression Screen PHQ 2 & 9 Depression Scale- Over the past 2 weeks, how often have you been bothered by any of the following problems? Little interest or pleasure in doing things: 0 Feeling down, depressed, or hopeless (PHQ Adolescent also includes...irritable): 0 PHQ-2 Total Score: 0 Trouble falling or staying asleep, or sleeping too much: 1 (staying asleep) Feeling tired or having little energy: 1 Poor appetite or overeating (PHQ Adolescent also includes...weight loss): 0 Feeling bad about yourself - or that you are a failure or have let yourself or your family down: 0 Trouble concentrating on things, such as reading the newspaper or watching television (PHQ Adolescent also includes...like school work): 0 Moving or speaking so slowly that other people could have noticed. Or the  opposite - being so fidgety or restless that you have been moving around a lot more than usual: 0 Thoughts that you would be better off dead, or of hurting yourself in some way: 0 PHQ-9 Total Score: 2 If you checked off any problems, how difficult have these problems made it for you to do your work, take care of things at home, or get along with other people?: Not difficult at all  Depression Treatment Depression Interventions/Treatment : EYV7-0 Score <4 Follow-up Not Indicated     Goals Addressed               This Visit's  Progress     Patient Stated (pt-stated)        To lose a few pounds/2025             Objective:    Today's Vitals   10/01/24 1431  Weight: 153 lb 12.8 oz (69.8 kg)  Height: 5' 7 (1.702 m)   Body mass index is 24.09 kg/m.  Hearing/Vision screen Hearing Screening - Comments:: Wears hearing aides Vision Screening - Comments:: Denies vision issues./Dr. Bell/UTD  Immunizations and Health Maintenance Health Maintenance  Topic Date Due   DTaP/Tdap/Td (1 - Tdap) Never done   Zoster Vaccines- Shingrix (1 of 2) Never done   Pneumococcal Vaccine: 50+ Years (2 of 2 - PCV) 10/01/2013   Mammogram  05/15/2019   Influenza Vaccine  05/24/2024   COVID-19 Vaccine (5 - 2025-26 season) 06/24/2024   Medicare Annual Wellness (AWV)  10/01/2025   Bone Density Scan  Completed   Hepatitis C Screening  Completed   Meningococcal B Vaccine  Aged Out        Assessment/Plan:  This is a routine wellness examination for [place].  Patient Care Team: Gareth Mliss FALCON, FNP as PCP - General (Nurse Practitioner) Carolee Manus DASEN., MD (Ophthalmology)  I have personally reviewed and noted the following in the patient's chart:   Medical and social history Use of alcohol, tobacco or illicit drugs  Current medications and supplements including opioid prescriptions. Functional ability and status Nutritional status Physical activity Advanced directives List of other physicians Hospitalizations, surgeries, and ER visits in previous 12 months Vitals Screenings to include cognitive, depression, and falls Referrals and appointments  No orders of the defined types were placed in this encounter.  In addition, I have reviewed and discussed with patient certain preventive protocols, quality metrics, and best practice recommendations. A written personalized care plan for preventive services as well as general preventive health recommendations were provided to patient.   Renley Gutman L Lawana Hartzell, CMA   10/01/2024    Return in 1 year (on 10/01/2025).  After Visit Summary: (MyChart) Due to this being a telephonic visit, the after visit summary with patients personalized plan was offered to patient via MyChart   Nurse Notes: Patient is due for a Tdap and a pneumonia vaccine and would like to discuss further during her next office visit.  Patient declines the flu and shingrix vaccines. Patient is due for a mammogram and order has not been placed.  Please discuss during next office visit. She had no other concerns to address today.

## 2024-10-15 ENCOUNTER — Ambulatory Visit: Admitting: Nurse Practitioner

## 2024-10-15 ENCOUNTER — Encounter: Payer: Self-pay | Admitting: Nurse Practitioner

## 2024-10-15 VITALS — BP 134/82 | HR 53 | Temp 97.5°F | Ht 67.0 in | Wt 154.0 lb

## 2024-10-15 DIAGNOSIS — G43009 Migraine without aura, not intractable, without status migrainosus: Secondary | ICD-10-CM | POA: Diagnosis not present

## 2024-10-15 DIAGNOSIS — Z131 Encounter for screening for diabetes mellitus: Secondary | ICD-10-CM | POA: Diagnosis not present

## 2024-10-15 DIAGNOSIS — F419 Anxiety disorder, unspecified: Secondary | ICD-10-CM

## 2024-10-15 DIAGNOSIS — I10 Essential (primary) hypertension: Secondary | ICD-10-CM | POA: Diagnosis not present

## 2024-10-15 DIAGNOSIS — Z853 Personal history of malignant neoplasm of breast: Secondary | ICD-10-CM | POA: Insufficient documentation

## 2024-10-15 DIAGNOSIS — M816 Localized osteoporosis [Lequesne]: Secondary | ICD-10-CM

## 2024-10-15 DIAGNOSIS — I341 Nonrheumatic mitral (valve) prolapse: Secondary | ICD-10-CM | POA: Diagnosis not present

## 2024-10-15 DIAGNOSIS — E782 Mixed hyperlipidemia: Secondary | ICD-10-CM | POA: Diagnosis not present

## 2024-10-15 DIAGNOSIS — Z130 Encounter for screening for diseases of the blood and blood-forming organs and certain disorders involving the immune mechanism: Secondary | ICD-10-CM

## 2024-10-15 MED ORDER — METOPROLOL TARTRATE 25 MG PO TABS: 25.0000 mg | ORAL_TABLET | Freq: Two times a day (BID) | ORAL | 3 refills | Status: AC

## 2024-10-15 MED ORDER — ROSUVASTATIN CALCIUM 10 MG PO TABS: 5.0000 mg | ORAL_TABLET | ORAL | Status: AC

## 2024-10-15 MED ORDER — ALPRAZOLAM 0.25 MG PO TABS: ORAL_TABLET | ORAL | 0 refills | Status: AC

## 2024-10-15 NOTE — Progress Notes (Signed)
 "  BP 134/82   Pulse (!) 53   Temp (!) 97.5 F (36.4 C)   Ht 5' 7 (1.702 m)   Wt 154 lb (69.9 kg)   SpO2 98%   BMI 24.12 kg/m    Subjective:    Patient ID: Deborah Trevino, female    DOB: 1946/06/21, 78 y.o.   MRN: [id number]  HPI: Deborah Trevino is a 78 y.o. female  Chief Complaint  Patient presents with   Medical Management of Chronic Issues   Discussed the use of AI scribe software for clinical note transcription with the patient, who gave verbal consent to proceed.  History of Present Illness Deborah Trevino is a 78 year old female who presents for a six month follow-up.  Migraine headaches - Migraines are well-controlled with triple magnesium, vitamin B6, and vitamin B12 supplementation. - Occasional aura present with migraines. - Relief achieved by resting in a dark room for 15 to 20 minutes.  Hypertension - Managed with metoprolol  25 mg twice daily. - Home blood pressure readings consistently around 104/61 and 103/61. - Blood pressure readings in clinical settings are higher than at home.  Palpitations and anxiety related to mitral valve prolapse - Non-rheumatic mitral valve prolapse with occasional sensation of heart 'skipping a beat'. - Uses Xanax  0.25 mg as needed for anxiety related to palpitations. - A 30-day prescription of Xanax  typically lasts six to nine months.  Osteoporosis and physical activity - Osteoporosis attributed to prior radiation and chemotherapy for breast cancer. - Managed with vitamin D, calcium , water aerobics, and walking. - No participation in water aerobics since September due to cold weather and facility maintenance issues.  Breast discomfort post-mastectomy and implants - History of breast cancer with initial diagnosis in 1988 and last occurrence in 2012. - Breast implants have been uncomfortable since surgery, especially after intense physical activity. - No remaining breast tissue.  Hyperlipidemia and statin  therapy - Takes rosuvastatin  5 mg daily for hyperlipidemia. - Last lipid panel within normal range. - Currently taking half a tablet daily, resulting in pharmacy confusion regarding refills. - Experiences some leg puffiness, with improvement since reducing the dose.         10/01/2024    2:46 PM 04/03/2024    3:14 PM 12/29/2021   10:03 AM  Depression screen PHQ 2/9  Decreased Interest 0 0 0  Down, Depressed, Hopeless 0 0 0  PHQ - 2 Score 0 0 0  Altered sleeping 1    Tired, decreased energy 1    Change in appetite 0    Feeling bad or failure about yourself  0    Trouble concentrating 0    Moving slowly or fidgety/restless 0    Suicidal thoughts 0    PHQ-9 Score 2    Difficult doing work/chores Not difficult at all      Relevant past medical, surgical, family and social history reviewed and updated as indicated. Interim medical history since our last visit reviewed. Allergies and medications reviewed and updated.  Review of Systems  Constitutional: Negative for fever or weight change.  Respiratory: Negative for cough and shortness of breath.   Cardiovascular: Negative for chest pain or palpitations.  Gastrointestinal: Negative for abdominal pain, no bowel changes.  Musculoskeletal: Negative for gait problem or joint swelling.  Skin: Negative for rash.  Neurological: Negative for dizziness or headache.  No other specific complaints in a complete review of systems (except as listed in HPI above).  Objective:      BP 134/82   Pulse (!) 53   Temp (!) 97.5 F (36.4 C)   Ht 5' 7 (1.702 m)   Wt 154 lb (69.9 kg)   SpO2 98%   BMI 24.12 kg/m    Wt Readings from Last 3 Encounters:  10/15/24 154 lb (69.9 kg)  10/01/24 153 lb 12.8 oz (69.8 kg)  04/03/24 150 lb (68 kg)    Physical Exam GENERAL: Alert, cooperative, well developed, no acute distress HEENT: Normocephalic, normal oropharynx, moist mucous membranes CHEST: Clear to auscultation bilaterally, no wheezes,  rhonchi, or crackles CARDIOVASCULAR: Normal heart rate and rhythm, S1 and S2 normal without murmurs ABDOMEN: Soft, non-tender, non-distended, without organomegaly, normal bowel sounds EXTREMITIES: No cyanosis or edema NEUROLOGICAL: Cranial nerves grossly intact, moves all extremities without gross motor or sensory deficit  Results for orders placed or performed in visit on 04/03/24  CBC and differential   Collection Time: 10/28/23 12:00 AM  Result Value Ref Range   Hemoglobin 12.5 12.0 - 16.0   HCT 38 36 - 46   Neutrophils Absolute 3,407.00    Platelets 208 150 - 400 K/uL   WBC 5.4   CBC   Collection Time: 10/28/23 12:00 AM  Result Value Ref Range   RBC 4.31 3.87 - 5.11  Basic metabolic panel with GFR   Collection Time: 10/28/23 12:00 AM  Result Value Ref Range   Glucose 92    BUN 13 4 - 21   CO2 24 (A) 13 - 22   Creatinine 0.6 0.5 - 1.1   Potassium 4.8 3.5 - 5.1 mEq/L   Sodium 139 137 - 147   Chloride 106 99 - 108  Comprehensive metabolic panel with GFR   Collection Time: 10/28/23 12:00 AM  Result Value Ref Range   Globulin 2.0    eGFR 22    Calcium  9.1 8.7 - 10.7   Albumin 4.5 3.5 - 5.0  Hepatic function panel   Collection Time: 10/28/23 12:00 AM  Result Value Ref Range   Alkaline Phosphatase 52 25 - 125   ALT 15 7 - 35 U/L   AST 20 13 - 35   Bilirubin, Total 0.5           Assessment & Plan:   Problem List Items Addressed This Visit       Cardiovascular and Mediastinum   MVP (mitral valve prolapse)   Relevant Medications   metoprolol  tartrate (LOPRESSOR ) 25 MG tablet   rosuvastatin  (CRESTOR ) 10 MG tablet   Migraine without aura and without status migrainosus, not intractable   Relevant Medications   metoprolol  tartrate (LOPRESSOR ) 25 MG tablet   rosuvastatin  (CRESTOR ) 10 MG tablet   Hypertension - Primary   Relevant Medications   metoprolol  tartrate (LOPRESSOR ) 25 MG tablet   rosuvastatin  (CRESTOR ) 10 MG tablet     Musculoskeletal and Integument    Localized osteoporosis without current pathological fracture     Other   Mixed hyperlipidemia   Relevant Medications   metoprolol  tartrate (LOPRESSOR ) 25 MG tablet   rosuvastatin  (CRESTOR ) 10 MG tablet   History of breast cancer   Other Visit Diagnoses       Anxiety       Relevant Medications   ALPRAZolam  (XANAX ) 0.25 MG tablet     Screening for deficiency anemia         Screening for diabetes mellitus            Assessment and Plan Assessment &  Plan Migraine Migraines are well-controlled with triple magnesium B6 and B12. Occasional aura is present, and relief is achieved by resting in a dark room for 15-20 minutes. - Continue triple magnesium B6 and B12 for migraine management.  Primary hypertension Hypertension is well-controlled with metoprolol  25 mg twice daily. Home blood pressure readings are consistently normal, though elevated readings occur in the clinic, likely due to white coat syndrome. - Continue metoprolol  25 mg twice daily. - Rechecked blood pressure in the clinic.  Mitral valve prolapse Non-rheumatic mitral valve prolapse with occasional palpitations. Xanax  0.25 mg is used as needed for anxiety related to palpitations. A 30-day prescription lasts 6-9 months, indicating infrequent use. - Continue Xanax  0.25 mg as needed for anxiety related to palpitations.  Localized osteoporosis Osteoporosis attributed to past radiation and chemotherapy for breast cancer. Managed with vitamin D, calcium , water aerobics, and walking. - Continue vitamin D and calcium  supplementation. - Continue water aerobics and walking for osteoporosis management.  Mixed hyperlipidemia Managed with rosuvastatin  5 mg daily. Last lipid panel was within normal range. She is taking half a tablet daily  - patient would like to come off the cholesterol medication.  She is already taking 1/2 tablet,  will do 1/2 tablet every other day and recheck labs next visit.   Anxiety disorder Anxiety  related to mitral valve prolapse is managed with Xanax  0.25 mg as needed. She reports infrequent use and no signs of abuse. - Continue Xanax  0.25 mg as needed for anxiety.  History of breast cancer Breast cancer diagnosed in 1988 with last occurrence in 2012. No breast tissue remains, and no further mammogram screening is conducted. - No further mammogram screening is required.        Follow up plan: Return in about 6 months (around 04/15/2025) for follow up. "

## 2025-04-15 ENCOUNTER — Ambulatory Visit: Admitting: Nurse Practitioner
